# Patient Record
Sex: Male | Born: 1965 | Race: White | Hispanic: No | Marital: Married | Smoking: Never smoker
Health system: Southern US, Community
[De-identification: ages and names within clinical notes are randomized; demographics above are authoritative.]

## PROBLEM LIST (undated history)

## (undated) DIAGNOSIS — R112 Nausea with vomiting, unspecified: Secondary | ICD-10-CM

## (undated) DIAGNOSIS — E079 Disorder of thyroid, unspecified: Secondary | ICD-10-CM

## (undated) DIAGNOSIS — J302 Other seasonal allergic rhinitis: Secondary | ICD-10-CM

## (undated) DIAGNOSIS — C801 Malignant (primary) neoplasm, unspecified: Secondary | ICD-10-CM

## (undated) DIAGNOSIS — T4145XA Adverse effect of unspecified anesthetic, initial encounter: Secondary | ICD-10-CM

## (undated) DIAGNOSIS — J45909 Unspecified asthma, uncomplicated: Secondary | ICD-10-CM

## (undated) DIAGNOSIS — M1990 Unspecified osteoarthritis, unspecified site: Secondary | ICD-10-CM

## (undated) DIAGNOSIS — Z9889 Other specified postprocedural states: Secondary | ICD-10-CM

## (undated) DIAGNOSIS — T8859XA Other complications of anesthesia, initial encounter: Secondary | ICD-10-CM

## (undated) DIAGNOSIS — Z8489 Family history of other specified conditions: Secondary | ICD-10-CM

## (undated) DIAGNOSIS — T7840XA Allergy, unspecified, initial encounter: Secondary | ICD-10-CM

## (undated) DIAGNOSIS — D179 Benign lipomatous neoplasm, unspecified: Secondary | ICD-10-CM

## (undated) HISTORY — DX: Benign lipomatous neoplasm, unspecified: D17.9

## (undated) HISTORY — PX: COLONOSCOPY: SHX174

## (undated) HISTORY — PX: SPINE SURGERY: SHX786

## (undated) HISTORY — PX: ROTATOR CUFF REPAIR: SHX139

## (undated) HISTORY — DX: Disorder of thyroid, unspecified: E07.9

## (undated) HISTORY — PX: COLONOSCOPY: SHX5424

## (undated) HISTORY — PX: COLONOSCOPY W/ POLYPECTOMY: SHX1380

## (undated) HISTORY — PX: LUMBAR FUSION: SHX111

## (undated) HISTORY — DX: Other seasonal allergic rhinitis: J30.2

## (undated) HISTORY — DX: Unspecified osteoarthritis, unspecified site: M19.90

## (undated) HISTORY — DX: Allergy, unspecified, initial encounter: T78.40XA

---

## 2003-06-13 ENCOUNTER — Emergency Department (HOSPITAL_COMMUNITY): Admission: EM | Admit: 2003-06-13 | Discharge: 2003-06-13 | Payer: Self-pay

## 2005-01-20 HISTORY — PX: HIP SURGERY: SHX245

## 2005-05-20 ENCOUNTER — Ambulatory Visit: Payer: Self-pay | Admitting: Internal Medicine

## 2005-08-06 ENCOUNTER — Ambulatory Visit (HOSPITAL_COMMUNITY): Admission: RE | Admit: 2005-08-06 | Discharge: 2005-08-06 | Payer: Self-pay | Admitting: Orthopedic Surgery

## 2005-12-19 ENCOUNTER — Ambulatory Visit: Payer: Self-pay | Admitting: Internal Medicine

## 2006-01-02 ENCOUNTER — Ambulatory Visit: Payer: Self-pay | Admitting: Internal Medicine

## 2007-07-19 ENCOUNTER — Ambulatory Visit: Payer: Self-pay | Admitting: Internal Medicine

## 2007-07-19 LAB — CONVERTED CEMR LAB
Albumin: 4.1 g/dL (ref 3.5–5.2)
Bilirubin Urine: NEGATIVE
Bilirubin, Direct: 0.1 mg/dL (ref 0.0–0.3)
Blood in Urine, dipstick: NEGATIVE
CO2: 32 meq/L (ref 19–32)
Cholesterol: 181 mg/dL (ref 0–200)
Eosinophils Absolute: 0.2 10*3/uL (ref 0.0–0.7)
GFR calc Af Amer: 106 mL/min
GFR calc non Af Amer: 88 mL/min
HCT: 45.5 % (ref 39.0–52.0)
HDL: 42.5 mg/dL (ref >39.0)
LDL Cholesterol: 120 mg/dL — ABNORMAL HIGH (ref 0–99)
MCHC: 34.4 g/dL (ref 30.0–36.0)
MCV: 87.8 fL (ref 78.0–100.0)
Monocytes Absolute: 0.5 10*3/uL (ref 0.1–1.0)
Monocytes Relative: 9.2 % (ref 3.0–12.0)
Neutro Abs: 3 10*3/uL (ref 1.4–7.7)
Platelets: 225 10*3/uL (ref 150–400)
Protein, U semiquant: NEGATIVE
RBC: 5.18 M/uL (ref 4.22–5.81)
RDW: 12.3 % (ref 11.5–14.6)
Sodium: 141 meq/L (ref 135–145)
Specific Gravity, Urine: 1.01
Total CHOL/HDL Ratio: 4.3
VLDL: 18 mg/dL (ref 0–40)
WBC: 5.2 10*3/uL (ref 4.5–10.5)

## 2007-07-27 ENCOUNTER — Ambulatory Visit: Payer: Self-pay | Admitting: Internal Medicine

## 2007-07-27 DIAGNOSIS — J309 Allergic rhinitis, unspecified: Secondary | ICD-10-CM | POA: Insufficient documentation

## 2007-07-27 DIAGNOSIS — I10 Essential (primary) hypertension: Secondary | ICD-10-CM | POA: Insufficient documentation

## 2007-07-27 DIAGNOSIS — R10811 Right upper quadrant abdominal tenderness: Secondary | ICD-10-CM | POA: Insufficient documentation

## 2007-08-30 ENCOUNTER — Ambulatory Visit: Payer: Self-pay | Admitting: Internal Medicine

## 2007-08-30 ENCOUNTER — Encounter: Payer: Self-pay | Admitting: Internal Medicine

## 2007-08-30 DIAGNOSIS — M109 Gout, unspecified: Secondary | ICD-10-CM | POA: Insufficient documentation

## 2007-08-30 DIAGNOSIS — C44599 Other specified malignant neoplasm of skin of other part of trunk: Secondary | ICD-10-CM | POA: Insufficient documentation

## 2008-02-14 ENCOUNTER — Telehealth: Payer: Self-pay | Admitting: Internal Medicine

## 2008-02-22 ENCOUNTER — Ambulatory Visit: Payer: Self-pay | Admitting: Internal Medicine

## 2008-02-22 DIAGNOSIS — E291 Testicular hypofunction: Secondary | ICD-10-CM | POA: Insufficient documentation

## 2008-02-22 DIAGNOSIS — R635 Abnormal weight gain: Secondary | ICD-10-CM | POA: Insufficient documentation

## 2008-02-22 LAB — CONVERTED CEMR LAB
Cortisol, Plasma: 7.9 ug/dL
Testosterone: 253.15 ng/dL — ABNORMAL LOW (ref 350.00–890)

## 2008-02-23 ENCOUNTER — Telehealth: Payer: Self-pay | Admitting: Internal Medicine

## 2008-08-22 ENCOUNTER — Telehealth: Payer: Self-pay | Admitting: Internal Medicine

## 2008-08-24 ENCOUNTER — Ambulatory Visit: Payer: Self-pay | Admitting: Internal Medicine

## 2008-10-17 ENCOUNTER — Telehealth: Payer: Self-pay | Admitting: *Deleted

## 2008-10-27 ENCOUNTER — Ambulatory Visit: Payer: Self-pay | Admitting: Internal Medicine

## 2008-10-27 DIAGNOSIS — D179 Benign lipomatous neoplasm, unspecified: Secondary | ICD-10-CM

## 2008-10-27 HISTORY — DX: Benign lipomatous neoplasm, unspecified: D17.9

## 2009-11-23 ENCOUNTER — Telehealth: Payer: Self-pay | Admitting: Internal Medicine

## 2009-12-27 ENCOUNTER — Ambulatory Visit: Payer: Self-pay | Admitting: Internal Medicine

## 2009-12-27 ENCOUNTER — Encounter: Payer: Self-pay | Admitting: Internal Medicine

## 2009-12-27 LAB — CONVERTED CEMR LAB
Basophils Absolute: 0 10*3/uL (ref 0.0–0.1)
Bilirubin Urine: NEGATIVE
Blood in Urine, dipstick: NEGATIVE
CO2: 29 meq/L (ref 19–32)
Chloride: 102 meq/L (ref 96–112)
Cholesterol: 205 mg/dL — ABNORMAL HIGH (ref 0–200)
Creatinine, Ser: 1 mg/dL (ref 0.4–1.5)
Direct LDL: 135.7 mg/dL
Eosinophils Absolute: 0.1 10*3/uL (ref 0.0–0.7)
GFR calc non Af Amer: 84.31 mL/min (ref >60.00)
Glucose, Urine, Semiquant: NEGATIVE
HDL: 47.1 mg/dL (ref >39.00)
Hemoglobin: 16.2 g/dL (ref 13.0–17.0)
Lymphocytes Relative: 22.6 % (ref 12.0–46.0)
Lymphs Abs: 1.3 10*3/uL (ref 0.7–4.0)
MCV: 89.9 fL (ref 78.0–100.0)
Monocytes Absolute: 0.5 10*3/uL (ref 0.1–1.0)
Neutrophils Relative %: 66.6 % (ref 43.0–77.0)
Nitrite: NEGATIVE
Potassium: 4.8 meq/L (ref 3.5–5.1)
RBC: 5.2 M/uL (ref 4.22–5.81)
RDW: 12.5 % (ref 11.5–14.6)
Sodium: 140 meq/L (ref 135–145)
Specific Gravity, Urine: 1.01
Total CHOL/HDL Ratio: 4
Total Protein: 7.2 g/dL (ref 6.0–8.3)
Triglycerides: 95 mg/dL (ref 0.0–149.0)
Urobilinogen, UA: 0.2
VLDL: 19 mg/dL (ref 0.0–40.0)
WBC Urine, dipstick: NEGATIVE
pH: 7

## 2009-12-31 ENCOUNTER — Telehealth: Payer: Self-pay | Admitting: Internal Medicine

## 2010-01-02 ENCOUNTER — Encounter: Payer: Self-pay | Admitting: Internal Medicine

## 2010-02-14 ENCOUNTER — Encounter: Payer: Self-pay | Admitting: Internal Medicine

## 2010-02-15 ENCOUNTER — Encounter: Payer: Self-pay | Admitting: Internal Medicine

## 2010-02-15 ENCOUNTER — Ambulatory Visit
Admission: RE | Admit: 2010-02-15 | Discharge: 2010-02-15 | Payer: Self-pay | Source: Home / Self Care | Attending: Internal Medicine | Admitting: Internal Medicine

## 2010-02-15 DIAGNOSIS — R739 Hyperglycemia, unspecified: Secondary | ICD-10-CM | POA: Insufficient documentation

## 2010-02-15 LAB — CONVERTED CEMR LAB
Cholesterol, target level: 200 mg/dL
Hgb A1c MFr Bld: 5.5 % (ref <5.7)
Testosterone: 414.57 ng/dL (ref 250–890)

## 2010-02-19 NOTE — Progress Notes (Signed)
Summary: REQ FOR ORDER (Nicotine testing)  Phone Note Call from Patient   Caller: Patient   [phone] Summary of Call: Pt called to set up a cpx / labs.... Pt req a nicotine test be added for insurance purposes to prove that he is tobacco free  (cotinine level?)..... Can you advise order for same?  Initial call taken by: Debbra Riding,  November 23, 2009 10:30 AM  Follow-up for Phone Call        will set with cpx labs-rhonda to handle Follow-up by: Willy Eddy, LPN,  November 23, 2009 10:41 AM     Appended Document: REQ FOR ORDER (Nicotine testing) Pt notified to come in fasting for Dec. 8th, 2011 appt.... cpx labs will be done at same time nicotine testing is done per Dr Lovell Sheehan.

## 2010-02-21 NOTE — Progress Notes (Signed)
Summary: [facility] called. There is a problem will lab order  Phone Note From Other Clinic Call back at [phone] option 2   Caller: [facility] [place] Summary of Call: There is a problem with the most recent lab that was ordered. Pls call back asap.  Initial call taken by: Lucy Antigua,  December 31, 2009 1:39 PM  Follow-up for Phone Call        Called patient back at work. He needs to come back and have blood redrawn for the nicotine test. He said he would be back in [place]. afternoon. Follow-up by: Rita Ohara  Additional Follow-up for Phone Call Additional follow up Details #1::        thanks, gwen Additional Follow-up by: Willy Eddy, LPN,  December 31, 2009 2:25 PM

## 2010-02-26 ENCOUNTER — Telehealth: Payer: Self-pay | Admitting: Internal Medicine

## 2010-02-26 MED ORDER — TRAMADOL HCL 50 MG PO TABS: 50.0000 mg | ORAL_TABLET | Freq: Four times a day (QID) | ORAL | Status: DC | PRN

## 2010-02-26 NOTE — Telephone Encounter (Signed)
Pt called to check on lab results and also to get a script for Tramadol, as previously discussed.

## 2010-02-26 NOTE — Telephone Encounter (Signed)
Please disregard previous note- it was written in error- lab results given to pt and will send in tramadol

## 2010-02-26 NOTE — Telephone Encounter (Signed)
Pt informed daw medicine was sent in by dr Lovell Sheehan

## 2010-02-27 NOTE — Medication Information (Signed)
Summary: PA and Approval for Androderm  PA and Approval for Androderm   Imported By: Maryln Gottron 02/20/2010 08:52:16  _____________________________________________________________________  External Attachment:    Type:   Image     Comment:   External Document

## 2010-03-07 NOTE — Assessment & Plan Note (Signed)
Summary: CPX // RS   Vital Signs:  Patient profile:   45 year old male Height:      71 inches Weight:      174 pounds BMI:     24.36 Temp:     98.1 degrees F oral Pulse rate:   56 / minute Resp:     14 per minute BP sitting:   130 / 70  (left arm)  Vitals Entered By: Willy Eddy, LPN (February 15, 2010 3:28 PM) CC: cpx, Lipid Management Is Patient Diabetic? No   Primary Care Provider:  Stacie Glaze MD  CC:  cpx and Lipid Management.  History of Present Illness: The pt was asked about all immunizations, health maint. services that are appropriate to their age and was given guidance on diet exercize  and weight management   Lipid Management History:      Positive NCEP/ATP III risk factors include hypertension.  Negative NCEP/ATP III risk factors include male age less than 53 years old, non-diabetic, no family history for ischemic heart disease, non-tobacco-user status, no ASHD (atherosclerotic heart disease), no prior stroke/TIA, no peripheral vascular disease, and no history of aortic aneurysm.    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) 8)  Abdominal Tenderness Right Upper Quadrant  (ICD-789.61) 9)  Preventive Health Care  (ICD-V70.0) 10)  Family History of Colon Ca 1st Degree Relative <60  (ICD-V16.0) 11)  Allergic Rhinitis  (ICD-477.9) 12)  Hypertension  (ICD-401.9)  Current Problems (verified): 1)  Hypogonadism  (ICD-257.2) 2)  Lipoma of Other Skin and Subcutaneous Tissue   (ICD-214.1) 3)  Testosterone Deficiency  (ICD-257.2) 4)  Weight Gain  (ICD-783.1) 5)  Neoplasm, Malignant, Skin, 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 4 Pumps To Skin Daily As Directed 2)  Vitamin D 95621 Unit Caps (Ergocalciferol) .Marland Kitchen.. 1 Every Week  Current Medications (verified): 1)  Testim 50 Mg/5gm Gel (Testosterone) .... Uad As Directed 2)  Vitamin D 1000 Unit Caps (Cholecalciferol) .Marland Kitchen.. 1 Once Daily  Allergies (verified): No Known Drug Allergies  Past History:  Family History: Last updated: 07/27/2007 COPD Family History Hypertension Family History of Colon CA 1st degree relative <60  Social History: Last updated: 08/30/2007 Married Former Smoker Retired  Risk Factors: Exercise: yes (07/27/2007)  Risk Factors: Smoking Status: quit (02/15/2010) Cans of tobacco/wk: yes (02/15/2010) Passive Smoke Exposure: no (02/15/2010)  Past medical, surgical, family and social histories (including risk factors) reviewed, and no changes noted (except as noted below).  Past Medical History: Reviewed history from 07/27/2007 and no changes required. Hypertension Allergic rhinitis Testosterone def. 257.2  Family History: Reviewed  history from 07/27/2007 and no changes required. COPD Family History Hypertension Family History of Colon CA 1st degree relative <60  Social History: Reviewed history from 08/30/2007 and no changes required. Married Former Smoker Retired  Review of Systems  The patient denies anorexia, fever, weight loss, weight gain, vision loss, decreased hearing, hoarseness, chest pain,  syncope, dyspnea on exertion, peripheral edema, prolonged cough, headaches, hemoptysis, abdominal pain, melena, hematochezia, severe indigestion/heartburn, hematuria, incontinence, genital sores, muscle weakness, suspicious skin lesions, transient blindness, difficulty walking, depression, unusual weight change, abnormal bleeding, enlarged lymph nodes, angioedema, breast masses, and testicular masses.    Physical Exam  General:  Well-developed,well-nourished,in no acute distress; alert,appropriate and cooperative throughout examination Head:  normocephalic, atraumatic, and male-pattern balding.   Eyes:  No corneal or conjunctival inflammation noted. EOMI. Perrla. Funduscopic exam benign, without hemorrhages, exudates or papilledema. Vision grossly normal. Ears:  R ear normal and L ear normal.   Nose:  External nasal examination shows no deformity or inflammation. Nasal mucosa are pink and moist without lesions or exudates. Mouth:  Oral mucosa and oropharynx without lesions or exudates.  Teeth in good repair. Neck:  No deformities, masses, or tenderness noted. Chest Wall:  No deformities, masses, tenderness or gynecomastia noted. Lungs:  Normal respiratory effort, chest expands symmetrically. Lungs are clear to auscultation, no crackles or wheezes. Heart:  Normal rate and regular rhythm. S1 and S2 normal without gallop, murmur, click, rub or other extra sounds. Abdomen:  Bowel sounds positive,abdomen soft and non-tender without masses, organomegaly or hernias noted. Rectal:  No external abnormalities noted. Normal sphincter tone. No rectal masses or tenderness. Genitalia:  Testes bilaterally descended without nodularity, tenderness or masses. No scrotal masses or lesions. No penis lesions or urethral discharge. Prostate:  Prostate gland firm and smooth, no enlargement, nodularity, tenderness, mass, asymmetry or induration. Msk:  No deformity or scoliosis noted of thoracic or lumbar spine.   Pulses:   R and L carotid,radial,femoral,dorsalis pedis and posterior tibial pulses are full and equal bilaterally Neurologic:  No cranial nerve deficits noted. Station and gait are normal. Plantar reflexes are down-going bilaterally. DTRs are symmetrical throughout. Sensory, motor and coordinative functions appear intact. Skin:  tatoos Cervical Nodes:  No lymphadenopathy noted Axillary Nodes:  No palpable lymphadenopathy Inguinal Nodes:  No significant adenopathy Psych:  Cognition and judgment appear intact. Alert and cooperative with normal attention span and concentration. No apparent delusions, illusions, hallucinations   Impression & Recommendations:  Problem # 1:  PREVENTIVE HEALTH CARE (ICD-V70.0) Assessment Unchanged  The pt was asked about all immunizations, health maint. services that are appropriate to their age and was given guidance on diet exercize  and weight management  Td Booster: Tdap (07/27/2007)   Chol: 205 (12/27/2009)   HDL: 47.10 (12/27/2009)   LDL: 120 (07/19/2007)   TG: 95.0 (12/27/2009) TSH: 0.97 (12/27/2009)   PSA: 1.31 (08/24/2008)  Discussed using sunscreen, use of alcohol, drug use, self testicular exam, routine dental care, routine eye care, routine physical exam, seat belts, multiple vitamins, osteoporosis prevention, adequate calcium intake in diet, and recommendations for immunizations.  Discussed exercise and checking cholesterol.  Discussed gun safety, safe sex, and contraception. Also recommend checking PSA.  Problem # 2:  TESTOSTERONE DEFICIENCY (ICD-257.2) Assessment: Deteriorated  low t with hx of replacement  Problem # 3:  HYPERGLYCEMIA, FASTING (ICD-790.29) Assessment: Unchanged  possible due to caffene prioir to labs  Orders: Specimen Handling (11914)  Labs Reviewed: Creat: 1.0 (12/27/2009)     Complete Medication List: 1)  Testim 50 Mg/5gm Gel (Testosterone) .... Uad as directed 2)  Vitamin D 1000 Unit Caps (Cholecalciferol) .Marland Kitchen.. 1 once  daily  Other Orders: Venipuncture (40347) TLB-A1C / Hgb A1C (Glycohemoglobin) (83036-A1C) TLB-Testosterone, Total (84403-TESTO)  Lipid Assessment/Plan:      Based on NCEP/ATP III, the patient's risk factor category is "0-1 risk factors".  The patient's lipid goals are as follows: Total cholesterol goal is 200; LDL cholesterol goal is 160; HDL cholesterol goal is 40; Triglyceride goal is 150.  His LDL cholesterol goal has been met.     Patient Instructions: 1)  Please schedule a follow-up appointment in 1 year.   Orders Added: 1)  Venipuncture [36415] 2)  TLB-A1C / Hgb A1C (Glycohemoglobin) [83036-A1C] 3)  TLB-Testosterone, Total [84403-TESTO] 4)  Est. Patient 40-64 years [99396] 5)  Specimen Handling [99000]

## 2010-06-07 NOTE — Op Note (Signed)
Joshua Norris, Joshua Norris              ACCOUNT NO.:  [id number]   MEDICAL RECORD NO.:  [id number]          PATIENT TYPE:  AMB   LOCATION:  DAY                          FACILITY:  [facility]   PHYSICIAN:  Ollen Gross, M.D.    DATE OF BIRTH:  1965-08-13   DATE OF PROCEDURE:  08/06/2005  DATE OF DISCHARGE:                                 OPERATIVE REPORT   PREOPERATIVE DIAGNOSIS:  Right hip labral tear.   POSTOPERATIVE DIAGNOSIS:  Right hip labral tear, plus acetabular chondral  defect.   PROCEDURE:  Right hip arthroscopy with liver debridement and chondroplasty.   SURGEON:  Dr. Despina Hick.   ASSISTANT:  Avel Peace, P.A.-C.   ANESTHESIA:  General.   ESTIMATED BLOOD LOSS:  Minimal.   DRAINS:  None.   COMPLICATIONS:  None.   CONDITION:  Stable to recovery room.   CLINICAL NOTE:  Joshua Norris is a 45 year old male who has a 2+ year history of  discomfort in his right hip which got progressively worse over the past  couple months.  He at one point had an intra-articular hip injection which  lead to pain relief.  His exam and history are consistent with a labral  tear, and he presents now for arthroscopy and debridement.   PROCEDURE IN DETAIL:  After successful initiation of general anesthetic, the  patient's placed in the left lateral decubitus position with the right side  up.  The lateral padded perineal post is placed, and his right foot was  placed into the traction boot well padded.  Under fluoroscopic guidance, we  had tried traction to the hip to gain adequate distraction.  The hip and  thigh were then prepped and draped in usual sterile fashion.  Spinal needles  were then passed at the anterior and posterior peritrochanteric portal sites  and felt to enter the joint.  Twenty mL of saline was then injected through  posterior needle, and it showed outflow through the anterior needle  confirming that both are intra-articular.  Posterior portal was then  established, incision made with a  11 blade, and then the cannula passed into  the joint.  Once confirmed to be intra-articular, the inflow was initiated.  The fovea does not show any loose bodies.  The acetabulum overall looks  fine.  Posteriorly, there is a small labral tear which had some labral  fraying at the free margin.  There is no detachment.  The femoral head looks  normal throughout.  Anteriorly, at the anterior superior 2 o'clock position,  there is an area of labral tear and chondral blistering.  We then  established the anterior portal and probed this, and the acetabular chondral  blistering area is unstable.  Using a combination of a shaver and the  ArthroCare device, we debrided the labrum back to a stable base and then  just using a shaver debrided the small chondral defect down to a stable bony  base with stable cartilaginous edges.  The size of this is about 1 x 1 cm.  Everything appeared stable after this.  We then switched the working portal  to posterior  and camera portal to anterior.  I then debrided the posterior  labral tear which was very small back to a stable base with the shaver and  ArthroCare.  The arthroscopic equipment was then removed from the portals,  and then 20 mL of half percent Marcaine with epinephrine injected through  the inflow cannula, and then that is removed.  Traction is removed the  joint, and C-arm fluoroscopy confirms that there is concentric reduction of  the hip.  The incisions were then closed with interrupted 4-0 nylon.  Bulky  sterile dressings applied.  He was taken out of the traction boot, placed in  supine position, awakened and transferred to recovery in stable condition.      Ollen Gross, M.D.  Electronically Signed     FA/MEDQ  D:  08/06/2005  T:  08/06/2005  Job:  [id number]

## 2010-07-29 ENCOUNTER — Other Ambulatory Visit: Payer: Self-pay | Admitting: *Deleted

## 2010-07-29 MED ORDER — TESTOSTERONE 50 MG/5GM (1%) TD GEL: 5.0000 g | Freq: Every day | TRANSDERMAL | Status: DC

## 2010-12-28 ENCOUNTER — Encounter: Payer: Self-pay | Admitting: Internal Medicine

## 2011-02-04 ENCOUNTER — Other Ambulatory Visit: Payer: Self-pay | Admitting: *Deleted

## 2011-02-04 MED ORDER — TESTOSTERONE 50 MG/5GM (1%) TD GEL: 5.0000 g | Freq: Every day | TRANSDERMAL | Status: DC

## 2011-04-09 ENCOUNTER — Other Ambulatory Visit: Payer: Self-pay | Admitting: Internal Medicine

## 2011-08-15 ENCOUNTER — Other Ambulatory Visit: Payer: Self-pay | Admitting: *Deleted

## 2011-08-15 MED ORDER — TESTOSTERONE 50 MG/5GM (1%) TD GEL: 5.0000 g | Freq: Every day | TRANSDERMAL | Status: DC

## 2011-10-17 ENCOUNTER — Encounter: Payer: Self-pay | Admitting: Internal Medicine

## 2011-11-06 ENCOUNTER — Encounter: Payer: Self-pay | Admitting: Internal Medicine

## 2011-12-02 ENCOUNTER — Telehealth: Payer: Self-pay | Admitting: Internal Medicine

## 2011-12-02 NOTE — Telephone Encounter (Signed)
done

## 2011-12-02 NOTE — Telephone Encounter (Signed)
Pt would like to add PSA and testosterone level to cpx labs due to family history cancer and on testim. Can I sch?

## 2011-12-12 ENCOUNTER — Ambulatory Visit (AMBULATORY_SURGERY_CENTER): Payer: BC Managed Care – PPO | Admitting: *Deleted

## 2011-12-12 VITALS — Ht 70.0 in | Wt 178.0 lb

## 2011-12-12 DIAGNOSIS — Z800 Family history of malignant neoplasm of digestive organs: Secondary | ICD-10-CM

## 2011-12-12 DIAGNOSIS — Z1211 Encounter for screening for malignant neoplasm of colon: Secondary | ICD-10-CM

## 2011-12-12 MED ORDER — MOVIPREP 100 G PO SOLR: ORAL | Status: DC

## 2012-01-01 ENCOUNTER — Encounter: Payer: Self-pay | Admitting: Internal Medicine

## 2012-01-01 ENCOUNTER — Ambulatory Visit (AMBULATORY_SURGERY_CENTER): Payer: BC Managed Care – PPO | Admitting: Internal Medicine

## 2012-01-01 VITALS — BP 131/88 | HR 53 | Temp 97.1°F | Resp 14 | Ht 70.0 in | Wt 178.0 lb

## 2012-01-01 DIAGNOSIS — Z800 Family history of malignant neoplasm of digestive organs: Secondary | ICD-10-CM

## 2012-01-01 DIAGNOSIS — D126 Benign neoplasm of colon, unspecified: Secondary | ICD-10-CM

## 2012-01-01 DIAGNOSIS — Z1211 Encounter for screening for malignant neoplasm of colon: Secondary | ICD-10-CM

## 2012-01-01 MED ORDER — SODIUM CHLORIDE 0.9 % IV SOLN: 500.0000 mL | INTRAVENOUS | Status: DC

## 2012-01-01 NOTE — Progress Notes (Signed)
Propofol given over incremental dosages 

## 2012-01-01 NOTE — Op Note (Signed)
[facility] [address] [place], [address]   COLONOSCOPY PROCEDURE REPORT  PATIENT: Joshua Norris, Joshua Norris  MR#: [id number] BIRTHDATE: 1965-08-02 , 46  yrs. old GENDER: Male ENDOSCOPIST: Roxy Cedar, MD REFERRED XB:[id number] Recall PROCEDURE DATE:  01/01/2012 PROCEDURE:   Colonoscopy with snare polypectomy    x 1 ASA CLASS:   Class I INDICATIONS:Patient's immediate family (dad 94's) history of colon cancer.   Index exam 12-2005 negative MEDICATIONS: MAC sedation, administered by CRNA and propofol (Diprivan) 400mg  IV  DESCRIPTION OF PROCEDURE:   After the risks benefits and alternatives of the procedure were thoroughly explained, informed consent was obtained.  A digital rectal exam revealed no abnormalities of the rectum.   The LB [id number]  endoscope was introduced through the anus and advanced to the cecum, which was identified by both the appendix and ileocecal valve. No adverse events experienced.   The quality of the prep was good, using MoviPrep  The instrument was then slowly withdrawn as the colon was fully examined.      COLON FINDINGS: A single polyp 5mm in size was found at the cecum. A polypectomy was performed with a cold snare.  The resection was complete and the polyp tissue was completely retrieved.   Mild diverticulosis was noted in the sigmoid colon.   The colon mucosa was otherwise normal.  Retroflexed views revealed no abnormalities. The time to cecum=3 minutes 03 seconds.  Withdrawal time=13 minutes 06 seconds.  The scope was withdrawn and the procedure completed. COMPLICATIONS: There were no complications.  ENDOSCOPIC IMPRESSION: 1.   Single polyp 5mm in size was found at the cecum; polypectomy was performed with a cold snare 2.   Mild diverticulosis was noted in the sigmoid colon 3.   The colon mucosa was otherwise normal  RECOMMENDATIONS: 1. Follow up colonoscopy in 5 years   eSigned:  Roxy Cedar, MD  01/01/2012 12:40 PM   cc: Stacie Glaze, MD and The Patient   PATIENT NAME:  Joshua Norris, Joshua Norris MR#: [id number]

## 2012-01-01 NOTE — Progress Notes (Signed)
Patient did not experience any of the following events: a burn prior to discharge; a fall within the facility; wrong site/side/patient/procedure/implant event; or a hospital transfer or hospital admission upon discharge from the facility. (G8907) Patient did not have preoperative order for IV antibiotic SSI prophylaxis. (G8918)  

## 2012-01-01 NOTE — Progress Notes (Signed)
Called to room to assist during endoscopic procedure.  Patient ID and intended procedure confirmed with present staff. Received instructions for my participation in the procedure from the performing physician. ewm 

## 2012-01-01 NOTE — Patient Instructions (Addendum)

## 2012-01-02 ENCOUNTER — Telehealth: Payer: Self-pay | Admitting: *Deleted

## 2012-01-02 NOTE — Telephone Encounter (Signed)
Name identifier, left message follow-up  

## 2012-01-07 ENCOUNTER — Encounter: Payer: Self-pay | Admitting: Internal Medicine

## 2012-02-17 ENCOUNTER — Other Ambulatory Visit: Payer: Self-pay | Admitting: Internal Medicine

## 2012-02-24 ENCOUNTER — Other Ambulatory Visit (INDEPENDENT_AMBULATORY_CARE_PROVIDER_SITE_OTHER): Payer: BC Managed Care – PPO

## 2012-02-24 DIAGNOSIS — Z0000 Encounter for general adult medical examination without abnormal findings: Secondary | ICD-10-CM

## 2012-02-24 LAB — BASIC METABOLIC PANEL WITH GFR
BUN: 13 mg/dL (ref 6–23)
CO2: 32 meq/L (ref 19–32)
Calcium: 9.4 mg/dL (ref 8.4–10.5)
Chloride: 104 meq/L (ref 96–112)
Creatinine, Ser: 0.9 mg/dL (ref 0.4–1.5)
GFR: 91.75 mL/min
Glucose, Bld: 107 mg/dL — ABNORMAL HIGH (ref 70–99)
Potassium: 5.8 meq/L — ABNORMAL HIGH (ref 3.5–5.1)
Sodium: 140 meq/L (ref 135–145)

## 2012-02-24 LAB — HEPATIC FUNCTION PANEL
ALT: 23 U/L (ref 0–53)
AST: 24 U/L (ref 0–37)
Albumin: 4.1 g/dL (ref 3.5–5.2)
Total Bilirubin: 0.7 mg/dL (ref 0.3–1.2)
Total Protein: 6.9 g/dL (ref 6.0–8.3)

## 2012-02-24 LAB — POCT URINALYSIS DIPSTICK
Ketones, UA: NEGATIVE
Protein, UA: NEGATIVE
Spec Grav, UA: 1.015

## 2012-02-24 LAB — CBC WITH DIFFERENTIAL/PLATELET
Basophils Relative: 0.4 % (ref 0.0–3.0)
Eosinophils Relative: 2.4 % (ref 0.0–5.0)
HCT: 47.2 % (ref 39.0–52.0)
Hemoglobin: 16.2 g/dL (ref 13.0–17.0)
Lymphs Abs: 1.1 10*3/uL (ref 0.7–4.0)
Monocytes Relative: 8.1 % (ref 3.0–12.0)
Neutro Abs: 4.6 10*3/uL (ref 1.4–7.7)
WBC: 6.4 10*3/uL (ref 4.5–10.5)

## 2012-02-24 LAB — TSH: TSH: 0.95 u[IU]/mL (ref 0.35–5.50)

## 2012-02-24 LAB — TESTOSTERONE: Testosterone: 171.82 ng/dL — ABNORMAL LOW (ref 350.00–890.00)

## 2012-02-24 LAB — LIPID PANEL
Cholesterol: 168 mg/dL (ref 0–200)
LDL Cholesterol: 108 mg/dL — ABNORMAL HIGH (ref 0–99)
VLDL: 18.4 mg/dL (ref 0.0–40.0)

## 2012-03-03 ENCOUNTER — Encounter: Payer: Self-pay | Admitting: Internal Medicine

## 2012-03-03 ENCOUNTER — Ambulatory Visit (INDEPENDENT_AMBULATORY_CARE_PROVIDER_SITE_OTHER): Payer: BC Managed Care – PPO | Admitting: Internal Medicine

## 2012-03-03 VITALS — BP 110/70 | HR 72 | Temp 98.2°F | Resp 16 | Ht 70.0 in | Wt 178.0 lb

## 2012-03-03 DIAGNOSIS — E291 Testicular hypofunction: Secondary | ICD-10-CM

## 2012-03-03 DIAGNOSIS — D229 Melanocytic nevi, unspecified: Secondary | ICD-10-CM

## 2012-03-03 DIAGNOSIS — D239 Other benign neoplasm of skin, unspecified: Secondary | ICD-10-CM

## 2012-03-03 DIAGNOSIS — Z0000 Encounter for general adult medical examination without abnormal findings: Secondary | ICD-10-CM

## 2012-03-03 DIAGNOSIS — E875 Hyperkalemia: Secondary | ICD-10-CM

## 2012-03-03 LAB — BASIC METABOLIC PANEL
BUN: 16 mg/dL (ref 6–23)
CO2: 29 mEq/L (ref 19–32)
Chloride: 100 mEq/L (ref 96–112)
Potassium: 4.6 mEq/L (ref 3.5–5.1)

## 2012-03-03 MED ORDER — TESTOSTERONE 20.25 MG/1.25GM (1.62%) TD GEL: 1.0000 "application " | Freq: Every day | TRANSDERMAL | Status: DC

## 2012-03-03 NOTE — Progress Notes (Signed)
Subjective:    Patient ID: Joshua Norris, male    DOB: [birth year], 47 y.o.   MRN: [id number]  HPI  Patient presents for yearly examination.  He is a healthy 47 year old man who exercises regularly.  He presents with good blood pressure we reviewed his blood work which was all within normal limits with exception of an elevated potassium which may be related to 2 diet and that his wife also had an elevated potassium and the diet rich in potassium-containing foods.  He has hypogonadism on replacement of his potassium levels were low  Review of Systems  Constitutional: Negative for fever and fatigue.  HENT: Negative for hearing loss, congestion, neck pain and postnasal drip.   Eyes: Negative for discharge, redness and visual disturbance.  Respiratory: Negative for cough, shortness of breath and wheezing.   Cardiovascular: Negative for leg swelling.  Gastrointestinal: Negative for abdominal pain, constipation and abdominal distention.  Genitourinary: Negative for urgency and frequency.  Musculoskeletal: Negative for joint swelling and arthralgias.  Skin: Negative for color change and rash.  Neurological: Negative for weakness and light-headedness.  Hematological: Negative for adenopathy.  Psychiatric/Behavioral: Negative for behavioral problems.   Past Medical History  Diagnosis Date  . Arthritis   . Seasonal allergies     History   Social History  . Marital Status: Married    Spouse Name: N/A    Number of Children: N/A  . Years of Education: N/A   Occupational History  . Not on file.   Social History Main Topics  . Smoking status: Never Smoker   . Smokeless tobacco: Former Neurosurgeon    Quit date: 12/12/2007  . Alcohol Use: Yes     Comment: occasional  . Drug Use: No  . Sexually Active: Not on file   Other Topics Concern  . Not on file   Social History Narrative  . No narrative on file    Past Surgical History  Procedure Laterality Date  . Hip surgery  2007    right; torn labrum    Family History  Problem Relation Age of Onset  . Colon cancer Father 83  . Pancreatic cancer Father 13  . Stomach cancer Neg Hx     No Known Allergies  Current Outpatient Prescriptions on File Prior to Visit  Medication Sig Dispense Refill  . B Complex Vitamins (B COMPLEX-B12 PO) Take by mouth daily.      . cholecalciferol (VITAMIN D) 1000 UNITS tablet Take 1,000 Units by mouth daily.      . Coenzyme Q10 (COQ10 PO) Take by mouth daily.      . fish oil-omega-3 fatty acids 1000 MG capsule Take 1 g by mouth 2 (two) times daily.      . Flaxseed, Linseed, (FLAXSEED OIL PO) Take by mouth daily.      . Ginkgo Biloba (GINKOBA PO) Take by mouth 2 (two) times daily.      Marland Kitchen glucosamine-chondroitin 500-400 MG tablet Take 1 tablet by mouth daily.      Marland Kitchen GRAPE SEED EXTRACT PO Take by mouth daily.      . TESTIM 50 MG/5GM GEL APPLY ONCE A DAY AS DIRECTED  150 g  5  . traMADol (ULTRAM) 50 MG tablet TAKE 1 TABLET BY MOUTH EVERY 6 HOURS AS NEEDED FOR PAIN  50 tablet  6   No current facility-administered medications on file prior to visit.    BP 110/70  Pulse 72  Temp(Src) 98.2 F (36.8 C)  Resp 16  Ht 5\' 10"  (1.778 m)  Wt 178 lb (80.74 kg)  BMI 25.54 kg/m2       Objective:   Physical Exam  Constitutional: He is oriented to person, place, and time. He appears well-developed and well-nourished.  HENT:  Head: Normocephalic and atraumatic.  Eyes: Conjunctivae are normal. Pupils are equal, round, and reactive to light.  Neck: Normal range of motion. Neck supple.  Cardiovascular: Normal rate and regular rhythm.   Pulmonary/Chest: Effort normal and breath sounds normal.  Abdominal: Soft. Bowel sounds are normal.  Musculoskeletal: Normal range of motion.  Neurological: He is alert and oriented to person, place, and time.  Skin: Skin is warm and dry.  Psychiatric: He has a normal mood and affect. His behavior is normal.          Assessment & Plan:   Patient  presents for yearly preventative medicine examination.   all immunizations and health maintenance protocols were reviewed with the patient and they are up to date with these protocols.   screening laboratory values were reviewed with the patient including screening of hyperlipidemia PSA renal function and hepatic function.   There medications past medical history social history problem list and allergies were reviewed in detail.   Goals were established with regard to weight loss exercise diet in compliance with medications

## 2012-03-03 NOTE — Addendum Note (Signed)
Addended by: Willy Eddy on: 03/03/2012 12:53 PM   Modules accepted: Orders

## 2012-03-03 NOTE — Patient Instructions (Addendum)
The patient is instructed to continue all medications as prescribed. Schedule followup with check out clerk upon leaving the clinic  

## 2012-04-16 ENCOUNTER — Other Ambulatory Visit: Payer: Self-pay | Admitting: Internal Medicine

## 2012-08-31 ENCOUNTER — Other Ambulatory Visit: Payer: Self-pay | Admitting: Internal Medicine

## 2012-09-02 ENCOUNTER — Other Ambulatory Visit: Payer: Self-pay | Admitting: *Deleted

## 2013-03-15 ENCOUNTER — Other Ambulatory Visit: Payer: Self-pay | Admitting: Internal Medicine

## 2013-03-18 ENCOUNTER — Telehealth: Payer: Self-pay | Admitting: Internal Medicine

## 2013-03-18 NOTE — Telephone Encounter (Signed)
Per dr Arnoldo Morale- he may have I will put in with his lab visit lab work

## 2013-03-18 NOTE — Telephone Encounter (Signed)
Pt would like to add to cpx labs order testosterone level,psa and cea level.Pt father was dx with prostate and colon ca

## 2013-03-18 NOTE — Telephone Encounter (Signed)
Pt is aware.  

## 2013-04-20 ENCOUNTER — Other Ambulatory Visit: Payer: Self-pay | Admitting: Internal Medicine

## 2013-05-25 ENCOUNTER — Other Ambulatory Visit: Payer: Self-pay | Admitting: Internal Medicine

## 2013-06-29 ENCOUNTER — Other Ambulatory Visit: Payer: Self-pay | Admitting: Internal Medicine

## 2013-07-19 ENCOUNTER — Other Ambulatory Visit (INDEPENDENT_AMBULATORY_CARE_PROVIDER_SITE_OTHER): Payer: BC Managed Care – PPO

## 2013-07-19 DIAGNOSIS — Z0000 Encounter for general adult medical examination without abnormal findings: Secondary | ICD-10-CM

## 2013-07-19 LAB — CBC WITH DIFFERENTIAL/PLATELET
BASOS ABS: 0 10*3/uL (ref 0.0–0.1)
Basophils Relative: 0.7 % (ref 0.0–3.0)
Eosinophils Absolute: 0.2 10*3/uL (ref 0.0–0.7)
Eosinophils Relative: 4 % (ref 0.0–5.0)
HCT: 45.2 % (ref 39.0–52.0)
Hemoglobin: 15.3 g/dL (ref 13.0–17.0)
LYMPHS PCT: 26.4 % (ref 12.0–46.0)
Lymphs Abs: 1.2 10*3/uL (ref 0.7–4.0)
MCHC: 33.8 g/dL (ref 30.0–36.0)
MCV: 88.6 fl (ref 78.0–100.0)
MONOS PCT: 9.4 % (ref 3.0–12.0)
Monocytes Absolute: 0.4 10*3/uL (ref 0.1–1.0)
NEUTROS ABS: 2.7 10*3/uL (ref 1.4–7.7)
NEUTROS PCT: 59.5 % (ref 43.0–77.0)
Platelets: 234 10*3/uL (ref 150.0–400.0)
RBC: 5.1 Mil/uL (ref 4.22–5.81)
RDW: 12.9 % (ref 11.5–15.5)
WBC: 4.6 10*3/uL (ref 4.0–10.5)

## 2013-07-19 LAB — POCT URINALYSIS DIPSTICK
BILIRUBIN UA: NEGATIVE
Blood, UA: NEGATIVE
GLUCOSE UA: NEGATIVE
KETONES UA: NEGATIVE
LEUKOCYTES UA: NEGATIVE
NITRITE UA: NEGATIVE
PH UA: 5.5
Protein, UA: NEGATIVE
Spec Grav, UA: 1.025
Urobilinogen, UA: 0.2

## 2013-07-19 LAB — LIPID PANEL
Cholesterol: 169 mg/dL (ref 0–200)
HDL: 49 mg/dL (ref >39.00)
LDL Cholesterol: 108 mg/dL — ABNORMAL HIGH (ref 0–99)
NONHDL: 120
TRIGLYCERIDES: 61 mg/dL (ref 0.0–149.0)
Total CHOL/HDL Ratio: 3
VLDL: 12.2 mg/dL (ref 0.0–40.0)

## 2013-07-19 LAB — BASIC METABOLIC PANEL WITH GFR
BUN: 17 mg/dL (ref 6–23)
CO2: 31 meq/L (ref 19–32)
Calcium: 9.3 mg/dL (ref 8.4–10.5)
Chloride: 105 meq/L (ref 96–112)
Creatinine, Ser: 1 mg/dL (ref 0.4–1.5)
GFR: 84.91 mL/min
Glucose, Bld: 102 mg/dL — ABNORMAL HIGH (ref 70–99)
Potassium: 4.7 meq/L (ref 3.5–5.1)
Sodium: 140 meq/L (ref 135–145)

## 2013-07-19 LAB — HEPATIC FUNCTION PANEL
ALBUMIN: 4.2 g/dL (ref 3.5–5.2)
ALK PHOS: 48 U/L (ref 39–117)
ALT: 22 U/L (ref 0–53)
AST: 24 U/L (ref 0–37)
BILIRUBIN DIRECT: 0.2 mg/dL (ref 0.0–0.3)
Total Bilirubin: 0.9 mg/dL (ref 0.2–1.2)
Total Protein: 7 g/dL (ref 6.0–8.3)

## 2013-07-19 LAB — TSH: TSH: 1.58 u[IU]/mL (ref 0.35–4.50)

## 2013-07-19 LAB — TESTOSTERONE: Testosterone: 273.39 ng/dL — ABNORMAL LOW (ref 300.00–890.00)

## 2013-07-19 LAB — PSA: PSA: 1.11 ng/mL (ref 0.10–4.00)

## 2013-07-20 LAB — CEA: CEA: 0.5 ng/mL (ref 0.0–5.0)

## 2013-07-25 ENCOUNTER — Encounter: Payer: BC Managed Care – PPO | Admitting: Internal Medicine

## 2013-07-28 ENCOUNTER — Telehealth: Payer: Self-pay | Admitting: Internal Medicine

## 2013-07-28 NOTE — Telephone Encounter (Signed)
Pt called to ask if he can be put back Testim because his testosterone is higher  300 -400 and what he takes now is not getting above 300

## 2013-07-29 ENCOUNTER — Encounter: Payer: Self-pay | Admitting: Internal Medicine

## 2013-07-29 MED ORDER — TESTOSTERONE 50 MG/5GM (1%) TD GEL: 5.0000 g | Freq: Every day | TRANSDERMAL | Status: DC

## 2013-07-29 NOTE — Telephone Encounter (Signed)
change back to testim 5 gm apply to skin daily generic a ok

## 2013-07-29 NOTE — Telephone Encounter (Signed)
rx called in

## 2014-01-31 ENCOUNTER — Telehealth: Payer: Self-pay | Admitting: Internal Medicine

## 2014-01-31 NOTE — Telephone Encounter (Signed)
Yes enough to get through visit and we will discuss at that time

## 2014-01-31 NOTE — Telephone Encounter (Signed)
Is this ok to refill?  

## 2014-01-31 NOTE — Telephone Encounter (Signed)
Pt request refill testosterone (TESTIM) 50 MG/5GM (1%) GEL Pt has appt 2/25 w/ dr hunter  [facility] [address]

## 2014-02-01 MED ORDER — TESTOSTERONE 50 MG/5GM (1%) TD GEL: 5.0000 g | Freq: Every day | TRANSDERMAL | Status: DC

## 2014-02-01 NOTE — Telephone Encounter (Signed)
Medication called in 

## 2014-02-09 ENCOUNTER — Telehealth: Payer: Self-pay | Admitting: Internal Medicine

## 2014-02-09 NOTE — Telephone Encounter (Signed)
Joshua Norris, since he has failed androgel, can we resubmit? Thanks!

## 2014-02-09 NOTE — Telephone Encounter (Signed)
See below

## 2014-02-09 NOTE — Telephone Encounter (Signed)
PA for testosterone was denied.  Patient must try and fail one of the formulary alternatives:  Androgel or Androderm.

## 2014-02-09 NOTE — Telephone Encounter (Signed)
LM on pt VM tcb

## 2014-02-09 NOTE — Telephone Encounter (Signed)
Pt states he has been on a double pump of androgel %1.62 which did not work for him and he has tried Testim which also did not work. He states he has been on Testosterone for 5years with [name] and does not know why he has to have a PA for this. He states if he has no other choice he will try Androderm.

## 2014-02-09 NOTE — Telephone Encounter (Signed)
Please inform patient. Does he want to try alternative androgel?

## 2014-02-13 ENCOUNTER — Encounter: Payer: Self-pay | Admitting: Internal Medicine

## 2014-02-13 NOTE — Telephone Encounter (Signed)
Appeal has been faxed to [phone].

## 2014-02-13 NOTE — Telephone Encounter (Signed)
Pt following up on prior auth request for testim.  Have we heard anything yet? Thanks

## 2014-02-20 NOTE — Telephone Encounter (Signed)
Do you still have the paper with the Rx info on it?

## 2014-02-20 NOTE — Telephone Encounter (Signed)
An approval was received for the appeal submitted. Can you please re-send medication to pharmacy.

## 2014-02-20 NOTE — Telephone Encounter (Signed)
No, but it is still on his med list.

## 2014-02-21 MED ORDER — TESTOSTERONE 50 MG/5GM (1%) TD GEL: 5.0000 g | Freq: Every day | TRANSDERMAL | Status: DC

## 2014-02-21 NOTE — Telephone Encounter (Signed)
Testim resent

## 2014-02-24 ENCOUNTER — Telehealth: Payer: Self-pay | Admitting: Internal Medicine

## 2014-02-24 MED ORDER — TESTOSTERONE 4 MG/24HR TD PT24: 1.0000 | MEDICATED_PATCH | Freq: Every day | TRANSDERMAL | Status: DC

## 2014-02-24 NOTE — Telephone Encounter (Signed)
Please advise 

## 2014-02-24 NOTE — Telephone Encounter (Signed)
Printed and on your desk Joshua Norris.

## 2014-02-24 NOTE — Telephone Encounter (Signed)
Pt states the testosterone (TESTIM) 50 MG/5GM (1%) GEL was denied by insurance and is $398.00.  The alternative they will cover per pt is androderm.  Pt has appt 2/25 but wants to know if you can call in the androderm bc he has been off for a month. [facility] [address]

## 2014-02-27 MED ORDER — TESTOSTERONE 4 MG/24HR TD PT24: 1.0000 | MEDICATED_PATCH | Freq: Every day | TRANSDERMAL | Status: DC

## 2014-02-27 NOTE — Telephone Encounter (Signed)
Patient can take either. Whichever is covered and less expensive. I ordered the androderm as an alternative due to cost but if testim is going to be less expensive, then that is fine.

## 2014-02-27 NOTE — Telephone Encounter (Signed)
Medication refilled

## 2014-02-27 NOTE — Telephone Encounter (Signed)
See below

## 2014-02-27 NOTE — Telephone Encounter (Signed)
PA was received for Androderm.  I spoke to [facility] on [facility] and was told the patient paid $265.00 on 02/25/14 for the Testim, which the Appeal I was asked to submit for Testim was approved.   Which medication is the patient suppose to be on?

## 2014-03-16 ENCOUNTER — Ambulatory Visit (INDEPENDENT_AMBULATORY_CARE_PROVIDER_SITE_OTHER): Payer: BLUE CROSS/BLUE SHIELD | Admitting: Family Medicine

## 2014-03-16 ENCOUNTER — Encounter: Payer: Self-pay | Admitting: Family Medicine

## 2014-03-16 VITALS — BP 126/74 | Temp 98.0°F | Wt 185.0 lb

## 2014-03-16 DIAGNOSIS — Z86010 Personal history of colonic polyps: Secondary | ICD-10-CM | POA: Insufficient documentation

## 2014-03-16 DIAGNOSIS — I10 Essential (primary) hypertension: Secondary | ICD-10-CM | POA: Diagnosis not present

## 2014-03-16 DIAGNOSIS — M1990 Unspecified osteoarthritis, unspecified site: Secondary | ICD-10-CM | POA: Insufficient documentation

## 2014-03-16 DIAGNOSIS — E291 Testicular hypofunction: Secondary | ICD-10-CM

## 2014-03-16 DIAGNOSIS — Z808 Family history of malignant neoplasm of other organs or systems: Secondary | ICD-10-CM | POA: Insufficient documentation

## 2014-03-16 MED ORDER — TESTOSTERONE 50 MG/5GM (1%) TD GEL: 5.0000 g | Freq: Every day | TRANSDERMAL | Status: DC

## 2014-03-16 NOTE — Assessment & Plan Note (Signed)
Monitor 3x4 mm lesion on right low back

## 2014-03-16 NOTE — Progress Notes (Signed)
Joshua Reddish, MD Phone: [phone]  Subjective:  Patient presents today to establish care with me as their new primary care provider. Patient was formerly a patient of [name]. Chief complaint-noted.   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 Currently Testim as androgel levels were in 100s before change.  ROS- denies fatigue or erectile difficulties  Patient is very worried about cancer risk due to family history Father-pancreatic-age 55 with whipple-asymptomatically detected, colon age 74-4 mets to lungs, adrenal glands lymph node, prostate-age 1 and brother diagnosed at age 56, and melanoma34 66, brother with skin cancer  The following were reviewed and entered/updated in epic: Past Medical History  Diagnosis Date  . Arthritis   . Seasonal allergies   . Lipoma 10/27/2008    Excised from behind R arm     Patient Active Problem List   Diagnosis Date Noted  . 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 Essential hypertension 07/27/2007    Priority: Medium  . Family history of melanoma 03/16/2014    Priority: Low  . History of colonic polyps 03/16/2014    Priority: Low  . Osteoarthritis 03/16/2014    Priority: Low  . Hyperglycemia 02/15/2010    Priority: Low  . Allergic rhinitis 07/27/2007    Priority: Low   Past Surgical History  Procedure Laterality Date  . Hip surgery  2007    right; torn labrum    Family History  Problem Relation Age of Onset  . Colon cancer Father 9  . Pancreatic cancer Father 20  . Stomach cancer Neg Hx     Medications- reviewed and updated Current Outpatient Prescriptions  Medication Sig Dispense Refill  . B Complex Vitamins (B COMPLEX-B12  PO) Take by mouth daily.    . cholecalciferol (VITAMIN D) 1000 UNITS tablet Take 1,000 Units by mouth daily.    . Coenzyme Q10 (COQ10 PO) Take by mouth daily.    . Ginkgo Biloba (GINKOBA PO) Take by mouth 2 (two) times daily.    Marland Kitchen glucosamine-chondroitin 500-400 MG tablet Take 1 tablet by mouth daily.    Marland Kitchen GRAPE SEED EXTRACT PO Take by mouth daily.    Marland Kitchen testosterone (TESTIM) 50 MG/5GM (1%) GEL Place 5 g onto the skin daily. 5 g 5   No current facility-administered medications for this visit.    Allergies-reviewed and updated No Known Allergies  History   Social History  . Marital Status: Married    Spouse Name: N/A  . Number of Children: N/A  . Years of Education: N/A   Social History Main Topics  . Smoking status: Never Smoker   . Smokeless tobacco: Former Systems developer    Quit date: 12/12/2007  . Alcohol Use: Yes     Comment: occasional  . Drug Use: No  . Sexual Activity: Not on file   Other Topics Concern  . Not on file   Social History Narrative    ROS--See HPI   Objective: BP 126/74 mmHg  Temp(Src) 98 F (36.7 C)  Wt 185 lb (83.915 kg) Gen: NAD, resting comfortably HEENT: Mucous membranes are moist. Oropharynx normal CV: RRR no murmurs rubs or gallops Lungs: CTAB no crackles, wheeze, rhonchi Abdomen: soft/nontender/nondistended/normal bowel sounds. No rebound or guarding.  Ext: no edema, 2+ PT pulses Skin:  warm, dry, full skin exam performed except groin. Small 3x 4 mm lesion on right low back to keep an eye on.    Assessment/Plan:  Essential hypertension Previously elevated but controlled now with diet/exercise.    Testicular hypofunction Continue Testim which has received prior approval given failure on androgel   Family history of melanoma Monitor 3x4 mm lesion on right low back   Physical in July including PSA on testosterone and testosterone  The majority of the visit we discussed his family history of cancer and his anxiety and desire to be  proactive about this as a result, >50% of 25 minute office visit was spent on counseling and coordination of care.

## 2014-03-16 NOTE — Patient Instructions (Signed)
Let's see each other in July. Come a few days before for labs (make sure labs are in July). Labs should include a PSA and testosterone along with other standard labs.

## 2014-03-16 NOTE — Assessment & Plan Note (Signed)
Previously elevated but controlled now with diet/exercise.

## 2014-03-16 NOTE — Assessment & Plan Note (Signed)
Continue Testim which has received prior approval given failure on androgel

## 2014-08-08 ENCOUNTER — Other Ambulatory Visit (INDEPENDENT_AMBULATORY_CARE_PROVIDER_SITE_OTHER): Payer: BLUE CROSS/BLUE SHIELD

## 2014-08-08 DIAGNOSIS — E349 Endocrine disorder, unspecified: Secondary | ICD-10-CM

## 2014-08-08 DIAGNOSIS — Z0000 Encounter for general adult medical examination without abnormal findings: Secondary | ICD-10-CM

## 2014-08-08 DIAGNOSIS — E291 Testicular hypofunction: Secondary | ICD-10-CM | POA: Diagnosis not present

## 2014-08-08 LAB — HEPATIC FUNCTION PANEL
ALT: 27 U/L (ref 0–53)
AST: 26 U/L (ref 0–37)
Albumin: 4.6 g/dL (ref 3.5–5.2)
Alkaline Phosphatase: 57 U/L (ref 39–117)
Bilirubin, Direct: 0.1 mg/dL (ref 0.0–0.3)
TOTAL PROTEIN: 7.2 g/dL (ref 6.0–8.3)
Total Bilirubin: 0.9 mg/dL (ref 0.2–1.2)

## 2014-08-08 LAB — POCT URINALYSIS DIPSTICK
Bilirubin, UA: NEGATIVE
Blood, UA: NEGATIVE
Glucose, UA: NEGATIVE
KETONES UA: NEGATIVE
LEUKOCYTES UA: NEGATIVE
NITRITE UA: NEGATIVE
PROTEIN UA: NEGATIVE
Spec Grav, UA: 1.015
Urobilinogen, UA: 0.2
pH, UA: 5.5

## 2014-08-08 LAB — TSH: TSH: 1.47 u[IU]/mL (ref 0.35–4.50)

## 2014-08-08 LAB — CBC WITH DIFFERENTIAL/PLATELET
BASOS ABS: 0 10*3/uL (ref 0.0–0.1)
BASOS PCT: 0.2 % (ref 0.0–3.0)
EOS PCT: 3.8 % (ref 0.0–5.0)
Eosinophils Absolute: 0.2 10*3/uL (ref 0.0–0.7)
HEMATOCRIT: 48.8 % (ref 39.0–52.0)
HEMOGLOBIN: 16.9 g/dL (ref 13.0–17.0)
Lymphocytes Relative: 21.5 % (ref 12.0–46.0)
Lymphs Abs: 1.3 10*3/uL (ref 0.7–4.0)
MCHC: 34.7 g/dL (ref 30.0–36.0)
MCV: 87.2 fl (ref 78.0–100.0)
Monocytes Absolute: 0.4 10*3/uL (ref 0.1–1.0)
Monocytes Relative: 7.2 % (ref 3.0–12.0)
Neutro Abs: 4 10*3/uL (ref 1.4–7.7)
Neutrophils Relative %: 67.3 % (ref 43.0–77.0)
Platelets: 243 10*3/uL (ref 150.0–400.0)
RBC: 5.6 Mil/uL (ref 4.22–5.81)
RDW: 13.6 % (ref 11.5–15.5)
WBC: 6 10*3/uL (ref 4.0–10.5)

## 2014-08-08 LAB — BASIC METABOLIC PANEL
BUN: 20 mg/dL (ref 6–23)
CO2: 31 meq/L (ref 19–32)
Calcium: 9.8 mg/dL (ref 8.4–10.5)
Chloride: 99 mEq/L (ref 96–112)
Creatinine, Ser: 1.02 mg/dL (ref 0.40–1.50)
GFR: 82.62 mL/min (ref >60.00)
Glucose, Bld: 116 mg/dL — ABNORMAL HIGH (ref 70–99)
Potassium: 4.3 mEq/L (ref 3.5–5.1)
Sodium: 137 mEq/L (ref 135–145)

## 2014-08-08 LAB — TESTOSTERONE: Testosterone: 446.93 ng/dL (ref 300.00–890.00)

## 2014-08-08 LAB — LIPID PANEL
Cholesterol: 212 mg/dL — ABNORMAL HIGH (ref 0–200)
HDL: 55.9 mg/dL (ref >39.00)
LDL Cholesterol: 133 mg/dL — ABNORMAL HIGH (ref 0–99)
NONHDL: 156.1
Total CHOL/HDL Ratio: 4
Triglycerides: 115 mg/dL (ref 0.0–149.0)
VLDL: 23 mg/dL (ref 0.0–40.0)

## 2014-08-08 LAB — PSA: PSA: 1.08 ng/mL (ref 0.10–4.00)

## 2014-08-14 ENCOUNTER — Encounter: Payer: Self-pay | Admitting: Family Medicine

## 2014-08-14 ENCOUNTER — Ambulatory Visit (INDEPENDENT_AMBULATORY_CARE_PROVIDER_SITE_OTHER): Payer: BLUE CROSS/BLUE SHIELD | Admitting: Family Medicine

## 2014-08-14 DIAGNOSIS — Z0000 Encounter for general adult medical examination without abnormal findings: Secondary | ICD-10-CM

## 2014-08-14 MED ORDER — TESTOSTERONE 50 MG/5GM (1%) TD GEL: 5.0000 g | Freq: Every day | TRANSDERMAL | Status: DC

## 2014-08-14 NOTE — Progress Notes (Signed)
Garret Reddish, MD Phone: [phone]  Subjective:  Patient presents today for their annual physical. Chief complaint-noted.   BP- ran 5.6 miles today. At work 120s-130s/ 74-82. Better on repeat  Epidural steroid injection a week ago- after labs. Unclear why sugars elevated- did have artificial sweetener  Father end stage colon cancer. clinical trial   2.6% 10 year risk  Bevelyn Ngo is going to Fax to pharmacy testim - controlled  ROS- full  review of systems was completed and negative except for: some back and groin pain improved after epidural steroid injection  The following were reviewed and entered/updated in epic: Past Medical History  Diagnosis Date  . Arthritis   . Seasonal allergies   . Lipoma 10/27/2008    Excised from behind R arm     Patient Active Problem List   Diagnosis Date Noted  . 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 Essential hypertension 07/27/2007    Priority: Medium  . Family history of melanoma 03/16/2014    Priority: Low  . History of colonic polyps 03/16/2014    Priority: Low  . Osteoarthritis 03/16/2014    Priority: Low  . Hyperglycemia 02/15/2010    Priority: Low  . Allergic rhinitis 07/27/2007    Priority: Low   Past Surgical History  Procedure Laterality Date  . Hip surgery  2007    right; torn labrum  . Rotator cuff repair      avulsed infraspinatus and supraspinatus   Family History  Problem Relation Age of Onset  . Colon cancer Father 63    end stage  . Pancreatic cancer Father 46    asymptomatic at presentation  . Stomach cancer Neg Hx   . Prostate cancer Father 80  . Prostate cancer Brother 69  . Melanoma Father 5  . Hypertension Mother   . Sleep apnea Mother     overweight  . Emphysema Mother     smoker    Medications- reviewed and updated Current Outpatient Prescriptions  Medication Sig Dispense Refill  . B Complex Vitamins (B COMPLEX-B12 PO) Take by mouth daily.    . cholecalciferol (VITAMIN  D) 1000 UNITS tablet Take 1,000 Units by mouth daily.    . Coenzyme Q10 (COQ10 PO) Take by mouth daily.    . Ginkgo Biloba (GINKOBA PO) Take by mouth 2 (two) times daily.    Marland Kitchen glucosamine-chondroitin 500-400 MG tablet Take 1 tablet by mouth daily.    Marland Kitchen GRAPE SEED EXTRACT PO Take by mouth daily.    Marland Kitchen testosterone (TESTIM) 50 MG/5GM (1%) GEL Place 5 g onto the skin daily. 5 g 5   No current facility-administered medications for this visit.    Allergies-reviewed and updated No Known Allergies  History   Social History  . Marital Status: Married    Spouse Name: N/A  . Number of Children: N/A  . Years of Education: N/A   Social History Main Topics  . Smoking status: Never Smoker   . Smokeless tobacco: Former Systems developer    Quit date: 12/12/2007  . Alcohol Use: Yes     Comment: occasional  . Drug Use: No  . Sexual Activity: Not on file   Other Topics Concern  . None   Social History Narrative   Married. 1 son from 51st marriage 65 years old in 2016.       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 Oropharynx normal Neck: no thyromegaly CV: RRR no murmurs rubs or gallops Lungs: CTAB no crackles, wheeze, rhonchi Abdomen: soft/nontender/nondistended/normal bowel sounds. No rebound or guarding.  Rectal: normal tone, normal prostate, no masses or tenderness Ext: no edema Skin: warm, dry, 3-4 cm lesion left low back unchanged from previous Neuro: grossly normal, moves all extremities, PERRLA  Assessment/Plan:  49 y.o. male presenting for annual physical.  Health Maintenance counseling: 1. Anticipatory guidance: Patient counseled regarding regular dental exams, eye doctor yearly- [facility], wearing seatbelts,  wearing sunscreen. Does not go to derm- prefers to be evaluated in office here.  2. Risk factor reduction:  Advised patient of need for regular exercise and diet rich and fruits and vegetables to reduce risk of heart attack and stroke.  3. Immunizations/screenings/ancillary studies Health Maintenance Due  Topic Date Due  . HIV Screening - gave blood through [employer] 12/12/1980  4. Colon cancer screening-12/2011 with 5 year repeat due to family history 5. Prostate cancer screening- complete today due to brother prostate cancer age 33. Low risk 6. Also see AVS  Essential hypertension Controlled on repeat without medication  Testicular hypofunction Controlled with testim with testosterone >400 (difficulty controlling on androgel in past)- [facility] refilling.   1 year CPE   Meds ordered this encounter  Medications  . testosterone (TESTIM) 50 MG/5GM (1%) GEL    Sig: Place 5 g onto the skin daily.    Dispense:  5 g    Refill:  5

## 2014-08-14 NOTE — Assessment & Plan Note (Signed)
Controlled on repeat without medication

## 2014-08-14 NOTE — Patient Instructions (Addendum)
2 things to watch 1. Cholesterol trended up slightly but risk is still reasonable at 2.6%. Would not advise statin at this point. Continue healthy lifestyle you are living 2. Blood sugar up 2 years in a row- let's have you have black coffee only next year  Thinking about Dad's risks leading to your risk: Prostate cancer-Low risk prostate cancer given exam and PSA level Melanoma-Skin exam normal Pancreatic cancer- most difficult thing to assess, normal abdominal exam Colon cancer- repeat colonoscopy 2018

## 2014-08-14 NOTE — Assessment & Plan Note (Signed)
Controlled with testim with testosterone >400 (difficulty controlling on androgel in past)- [facility] refilling.

## 2015-03-08 ENCOUNTER — Other Ambulatory Visit: Payer: Self-pay | Admitting: Family Medicine

## 2015-03-08 NOTE — Telephone Encounter (Signed)
Yes thanks 

## 2015-03-08 NOTE — Telephone Encounter (Signed)
Refill ok? 

## 2015-09-19 DIAGNOSIS — M5136 Other intervertebral disc degeneration, lumbar region: Secondary | ICD-10-CM | POA: Diagnosis not present

## 2015-09-25 ENCOUNTER — Other Ambulatory Visit (INDEPENDENT_AMBULATORY_CARE_PROVIDER_SITE_OTHER): Payer: BLUE CROSS/BLUE SHIELD

## 2015-09-25 DIAGNOSIS — E291 Testicular hypofunction: Secondary | ICD-10-CM | POA: Diagnosis not present

## 2015-09-25 DIAGNOSIS — Z0000 Encounter for general adult medical examination without abnormal findings: Secondary | ICD-10-CM | POA: Diagnosis not present

## 2015-09-25 LAB — CBC WITH DIFFERENTIAL/PLATELET
BASOS ABS: 0 10*3/uL (ref 0.0–0.1)
Basophils Relative: 0.3 % (ref 0.0–3.0)
EOS PCT: 1.9 % (ref 0.0–5.0)
Eosinophils Absolute: 0.2 10*3/uL (ref 0.0–0.7)
HEMATOCRIT: 47.9 % (ref 39.0–52.0)
HEMOGLOBIN: 16.7 g/dL (ref 13.0–17.0)
LYMPHS PCT: 14.9 % (ref 12.0–46.0)
Lymphs Abs: 1.4 10*3/uL (ref 0.7–4.0)
MCHC: 34.8 g/dL (ref 30.0–36.0)
MCV: 86.5 fl (ref 78.0–100.0)
MONOS PCT: 7.3 % (ref 3.0–12.0)
Monocytes Absolute: 0.7 10*3/uL (ref 0.1–1.0)
NEUTROS PCT: 75.6 % (ref 43.0–77.0)
Neutro Abs: 7.1 10*3/uL (ref 1.4–7.7)
Platelets: 256 10*3/uL (ref 150.0–400.0)
RBC: 5.54 Mil/uL (ref 4.22–5.81)
RDW: 12.8 % (ref 11.5–15.5)
WBC: 9.4 10*3/uL (ref 4.0–10.5)

## 2015-09-25 LAB — LIPID PANEL
Cholesterol: 181 mg/dL (ref 0–200)
HDL: 53.1 mg/dL (ref >39.00)
LDL Cholesterol: 110 mg/dL — ABNORMAL HIGH (ref 0–99)
NONHDL: 127.69
Total CHOL/HDL Ratio: 3
Triglycerides: 88 mg/dL (ref 0.0–149.0)
VLDL: 17.6 mg/dL (ref 0.0–40.0)

## 2015-09-25 LAB — BASIC METABOLIC PANEL
BUN: 16 mg/dL (ref 6–23)
CALCIUM: 9.4 mg/dL (ref 8.4–10.5)
CHLORIDE: 101 meq/L (ref 96–112)
CO2: 30 mEq/L (ref 19–32)
CREATININE: 1.04 mg/dL (ref 0.40–1.50)
GFR: 80.42 mL/min (ref >60.00)
Glucose, Bld: 112 mg/dL — ABNORMAL HIGH (ref 70–99)
Potassium: 4.2 mEq/L (ref 3.5–5.1)
Sodium: 137 mEq/L (ref 135–145)

## 2015-09-25 LAB — TSH: TSH: 1.38 u[IU]/mL (ref 0.35–4.50)

## 2015-09-25 LAB — HEPATIC FUNCTION PANEL
ALBUMIN: 4.4 g/dL (ref 3.5–5.2)
ALK PHOS: 60 U/L (ref 39–117)
ALT: 20 U/L (ref 0–53)
AST: 17 U/L (ref 0–37)
Bilirubin, Direct: 0.1 mg/dL (ref 0.0–0.3)
TOTAL PROTEIN: 7.3 g/dL (ref 6.0–8.3)
Total Bilirubin: 0.7 mg/dL (ref 0.2–1.2)

## 2015-09-25 LAB — POC URINALSYSI DIPSTICK (AUTOMATED)
Bilirubin, UA: NEGATIVE
Glucose, UA: NEGATIVE
Ketones, UA: NEGATIVE
Leukocytes, UA: NEGATIVE
Nitrite, UA: NEGATIVE
Protein, UA: NEGATIVE
RBC UA: NEGATIVE
UROBILINOGEN UA: 0.2
pH, UA: 6

## 2015-09-25 LAB — PSA: PSA: 1.18 ng/mL (ref 0.10–4.00)

## 2015-09-25 LAB — TESTOSTERONE: TESTOSTERONE: 114.8 ng/dL — AB (ref 300.00–890.00)

## 2015-10-01 ENCOUNTER — Ambulatory Visit (INDEPENDENT_AMBULATORY_CARE_PROVIDER_SITE_OTHER): Payer: BLUE CROSS/BLUE SHIELD | Admitting: Family Medicine

## 2015-10-01 ENCOUNTER — Encounter: Payer: Self-pay | Admitting: Family Medicine

## 2015-10-01 VITALS — BP 136/88 | HR 57 | Temp 98.2°F | Ht 70.5 in | Wt 176.8 lb

## 2015-10-01 DIAGNOSIS — Z0000 Encounter for general adult medical examination without abnormal findings: Secondary | ICD-10-CM | POA: Diagnosis not present

## 2015-10-01 NOTE — Patient Instructions (Addendum)
Flu shot at work- send [place] a [employer] when you get it  Colonoscopy due 2018  Agree seeing dermatology the right thing- you were going to call [facility]- if they request referral we can certainly do this for you  I could not find path report for the arm but provided copies of other removals  Really appreciate your perspective on the testosterone- if you become symptomatic lets update testosterone and consider adjustment depending on findings  Let's avoid any sweeteners before fasting labs next year

## 2015-10-01 NOTE — Progress Notes (Signed)
Pre visit review using our clinic review tool, if applicable. No additional management support is needed unless otherwise documented below in the visit note. 

## 2015-10-01 NOTE — Progress Notes (Signed)
Phone: [phone]  Subjective:  Patient presents today for their annual physical. Chief complaint-noted.   See problem oriented charting- ROS- full  review of systems was completed and negative except for: deals with back pain intermittently  The following were reviewed and entered/updated in epic: Past Medical History:  Diagnosis Date  . Arthritis   . Lipoma 10/27/2008   Excised from behind R arm    . Seasonal allergies    Patient Active Problem List   Diagnosis Date Noted  . 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 Essential hypertension 07/27/2007    Priority: Medium  . Family history of melanoma 03/16/2014    Priority: Low  . History of colonic polyps 03/16/2014    Priority: Low  . Osteoarthritis 03/16/2014    Priority: Low  . Hyperglycemia 02/15/2010    Priority: Low  . Allergic rhinitis 07/27/2007    Priority: Low   Past Surgical History:  Procedure Laterality Date  . HIP SURGERY  2005/05/06   right; torn labrum  . ROTATOR CUFF REPAIR     avulsed infraspinatus and supraspinatus    Family History  Problem Relation Age of Onset  . Hypertension Mother   . Sleep apnea Mother     overweight  . Emphysema Mother     smoker  . Colon cancer Father 78    end stage. died age 26 in 05/07/14.   Marland Kitchen Pancreatic cancer Father 21    asymptomatic at presentation  . Prostate cancer Father 25  . Melanoma Father 60  . Prostate cancer Brother 67    now metastatic  . Stomach cancer Neg Hx     Medications- reviewed and updated Current Outpatient Prescriptions  Medication Sig Dispense Refill  . B Complex Vitamins (B COMPLEX-B12 PO) Take by mouth daily.    . cholecalciferol (VITAMIN D) 1000 UNITS tablet Take 1,000 Units by mouth daily.    . Coenzyme Q10 (COQ10 PO) Take by mouth daily.    . Flaxseed, Linseed, (FLAX SEEDS PO) Take 1 tablet by mouth.    . Ginkgo Biloba (GINKOBA PO) Take by mouth 2 (two) times daily.    Marland Kitchen glucosamine-chondroitin 500-400 MG tablet  Take 1 tablet by mouth daily.    Marland Kitchen GRAPE SEED EXTRACT PO Take by mouth daily.    Marland Kitchen testosterone (TESTIM) 50 MG/5GM (1%) GEL Place 5 g onto the skin daily.     No current facility-administered medications for this visit.     Allergies-reviewed and updated No Known Allergies  Social History   Social History  . Marital status: Married    Spouse name: N/A  . Number of children: N/A  . Years of education: N/A   Social History Main Topics  . Smoking status: Never Smoker  . Smokeless tobacco: Former Systems developer    Quit date: 12/12/2007  . Alcohol use Yes     Comment: occasional  . Drug use: No  . Sexual activity: Not Asked   Other Topics Concern  . None   Social History Narrative   Married. 1 son from 31st marriage 67 years old in May 07, 2014.       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 Oropharynx normal Neck: no thyromegaly CV: RRR no murmurs rubs or gallops Lungs: CTAB no crackles, wheeze, rhonchi Abdomen: soft/nontender/nondistended/normal bowel sounds. No rebound or guarding.  Ext: no edema Skin: warm, dry Neuro: grossly normal, moves all extremities, PERRLA Rectal: normal tone, norma sized prostate, no masses or tenderness   Assessment/Plan:  50 y.o. male presenting for annual physical.  Health Maintenance counseling: 1. Anticipatory guidance: Patient counseled regarding regular dental exams, eye exams- yearly, wearing seatbelts.  2. Risk factor reduction:  Advised patient of need for regular exercise and diet rich and fruits and vegetables to reduce risk of heart attack and stroke. Fitness trainer 3.  Immunizations/screenings/ancillary studies- gets flu shot at work Immunization History  Administered Date(s) Administered  . Influenza-Unspecified 10/04/2013  . Td 07/27/2007   4. Prostate cancer screening- low risk based off PSA trend and rectal   Lab Results  Component Value Date   PSA 1.18 09/25/2015   PSA 1.08 08/08/2014   PSA 1.11 07/19/2013   5. Colon cancer screening - 2013 with adenoma, repeat 2018.  6. Skin cancer screening- family history ofmelanoma- he has not seen dermatology. Will do to follow up lesion right low back  Status of chronic or acute concerns  Testicular hypofunction- low on last check on testim gel Lab Results  Component Value Date   TESTOSTERONE 114.80 (L) 09/25/2015   HTN- diet/exercise controlled thoguh SBP and DBP near limits. Has lost another lb  Hyperglycemia- 112 on labs, creamer in coffee again- advised against  Hyperlipidemia- 2.8% 10 year risk- remain off statin. Intensely against- would really tighten diet if got in range  Return in about 1 year (around 09/30/2016) for physical.  Meds ordered this encounter  Medications  . testosterone (TESTIM) 50 MG/5GM (1%) GEL    Sig: Place 5 g onto the skin daily.  . Flaxseed, Linseed, (FLAX SEEDS PO)    Sig: Take 1 tablet by mouth.    Return precautions advised.   Garret Reddish, MD

## 2015-10-04 ENCOUNTER — Other Ambulatory Visit: Payer: Self-pay | Admitting: Family Medicine

## 2015-12-03 DIAGNOSIS — M545 Low back pain: Secondary | ICD-10-CM | POA: Diagnosis not present

## 2015-12-06 DIAGNOSIS — M545 Low back pain: Secondary | ICD-10-CM | POA: Diagnosis not present

## 2015-12-25 DIAGNOSIS — R030 Elevated blood-pressure reading, without diagnosis of hypertension: Secondary | ICD-10-CM | POA: Diagnosis not present

## 2015-12-25 DIAGNOSIS — M4316 Spondylolisthesis, lumbar region: Secondary | ICD-10-CM | POA: Diagnosis not present

## 2015-12-25 DIAGNOSIS — Z6825 Body mass index (BMI) 25.0-25.9, adult: Secondary | ICD-10-CM | POA: Diagnosis not present

## 2016-01-16 ENCOUNTER — Other Ambulatory Visit: Payer: Self-pay | Admitting: Family Medicine

## 2016-01-16 DIAGNOSIS — M4316 Spondylolisthesis, lumbar region: Secondary | ICD-10-CM | POA: Diagnosis not present

## 2016-02-05 DIAGNOSIS — M4316 Spondylolisthesis, lumbar region: Secondary | ICD-10-CM | POA: Diagnosis not present

## 2016-02-27 DIAGNOSIS — M47816 Spondylosis without myelopathy or radiculopathy, lumbar region: Secondary | ICD-10-CM | POA: Diagnosis not present

## 2016-03-12 DIAGNOSIS — M4316 Spondylolisthesis, lumbar region: Secondary | ICD-10-CM | POA: Diagnosis not present

## 2016-03-12 DIAGNOSIS — M47816 Spondylosis without myelopathy or radiculopathy, lumbar region: Secondary | ICD-10-CM | POA: Diagnosis not present

## 2016-03-12 DIAGNOSIS — M5136 Other intervertebral disc degeneration, lumbar region: Secondary | ICD-10-CM | POA: Diagnosis not present

## 2016-03-18 DIAGNOSIS — M47816 Spondylosis without myelopathy or radiculopathy, lumbar region: Secondary | ICD-10-CM | POA: Diagnosis not present

## 2016-03-18 DIAGNOSIS — M4316 Spondylolisthesis, lumbar region: Secondary | ICD-10-CM | POA: Diagnosis not present

## 2016-04-22 ENCOUNTER — Other Ambulatory Visit: Payer: Self-pay | Admitting: Family Medicine

## 2016-05-02 DIAGNOSIS — M5136 Other intervertebral disc degeneration, lumbar region: Secondary | ICD-10-CM | POA: Diagnosis not present

## 2016-05-02 DIAGNOSIS — M47816 Spondylosis without myelopathy or radiculopathy, lumbar region: Secondary | ICD-10-CM | POA: Diagnosis not present

## 2016-06-24 ENCOUNTER — Other Ambulatory Visit: Payer: Self-pay | Admitting: Neurological Surgery

## 2016-06-24 DIAGNOSIS — R030 Elevated blood-pressure reading, without diagnosis of hypertension: Secondary | ICD-10-CM | POA: Diagnosis not present

## 2016-06-24 DIAGNOSIS — M4316 Spondylolisthesis, lumbar region: Secondary | ICD-10-CM | POA: Diagnosis not present

## 2016-06-24 DIAGNOSIS — Z6825 Body mass index (BMI) 25.0-25.9, adult: Secondary | ICD-10-CM | POA: Diagnosis not present

## 2016-07-10 DIAGNOSIS — Z0100 Encounter for examination of eyes and vision without abnormal findings: Secondary | ICD-10-CM | POA: Diagnosis not present

## 2016-07-15 ENCOUNTER — Encounter (HOSPITAL_COMMUNITY)
Admission: RE | Admit: 2016-07-15 | Discharge: 2016-07-15 | Disposition: A | Discharge status: Home / Self Care | Payer: BLUE CROSS/BLUE SHIELD | Source: Ambulatory Visit | Attending: Neurological Surgery | Admitting: Neurological Surgery

## 2016-07-15 ENCOUNTER — Encounter (HOSPITAL_COMMUNITY): Payer: Self-pay

## 2016-07-15 ENCOUNTER — Ambulatory Visit (HOSPITAL_COMMUNITY)
Admission: RE | Admit: 2016-07-15 | Discharge: 2016-07-15 | Disposition: A | Discharge status: Home / Self Care | Payer: BLUE CROSS/BLUE SHIELD | Source: Ambulatory Visit | Attending: Neurological Surgery | Admitting: Neurological Surgery

## 2016-07-15 DIAGNOSIS — M4310 Spondylolisthesis, site unspecified: Secondary | ICD-10-CM | POA: Insufficient documentation

## 2016-07-15 DIAGNOSIS — Z01810 Encounter for preprocedural cardiovascular examination: Secondary | ICD-10-CM | POA: Insufficient documentation

## 2016-07-15 DIAGNOSIS — J45909 Unspecified asthma, uncomplicated: Secondary | ICD-10-CM | POA: Diagnosis not present

## 2016-07-15 DIAGNOSIS — R001 Bradycardia, unspecified: Secondary | ICD-10-CM | POA: Insufficient documentation

## 2016-07-15 DIAGNOSIS — Z01812 Encounter for preprocedural laboratory examination: Secondary | ICD-10-CM | POA: Insufficient documentation

## 2016-07-15 HISTORY — DX: Unspecified asthma, uncomplicated: J45.909

## 2016-07-15 HISTORY — DX: Adverse effect of unspecified anesthetic, initial encounter: T41.45XA

## 2016-07-15 HISTORY — DX: Malignant (primary) neoplasm, unspecified: C80.1

## 2016-07-15 HISTORY — DX: Other complications of anesthesia, initial encounter: T88.59XA

## 2016-07-15 HISTORY — DX: Family history of other specified conditions: Z84.89

## 2016-07-15 LAB — CBC WITH DIFFERENTIAL/PLATELET
Basophils Absolute: 0 10*3/uL (ref 0.0–0.1)
Basophils Relative: 1 %
EOS ABS: 0.2 10*3/uL (ref 0.0–0.7)
EOS PCT: 4 %
HCT: 45.9 % (ref 39.0–52.0)
Hemoglobin: 16.1 g/dL (ref 13.0–17.0)
LYMPHS ABS: 1.6 10*3/uL (ref 0.7–4.0)
LYMPHS PCT: 26 %
MCH: 30.1 pg (ref 26.0–34.0)
MCHC: 35.1 g/dL (ref 30.0–36.0)
MCV: 85.8 fL (ref 78.0–100.0)
MONO ABS: 0.4 10*3/uL (ref 0.1–1.0)
MONOS PCT: 7 %
Neutro Abs: 3.8 10*3/uL (ref 1.7–7.7)
Neutrophils Relative %: 62 %
PLATELETS: 221 10*3/uL (ref 150–400)
RBC: 5.35 MIL/uL (ref 4.22–5.81)
RDW: 12.5 % (ref 11.5–15.5)
WBC: 6.1 10*3/uL (ref 4.0–10.5)

## 2016-07-15 LAB — PROTIME-INR
INR: 0.91
PROTHROMBIN TIME: 12.3 s (ref 11.4–15.2)

## 2016-07-15 LAB — ABO/RH: ABO/RH(D): A POS

## 2016-07-15 LAB — TYPE AND SCREEN
ABO/RH(D): A POS
ANTIBODY SCREEN: NEGATIVE

## 2016-07-15 LAB — SURGICAL PCR SCREEN
MRSA, PCR: NEGATIVE
Staphylococcus aureus: NEGATIVE

## 2016-07-15 LAB — BASIC METABOLIC PANEL
Anion gap: 7 (ref 5–15)
BUN: 19 mg/dL (ref 6–20)
CHLORIDE: 103 mmol/L (ref 101–111)
CO2: 28 mmol/L (ref 22–32)
CREATININE: 1.08 mg/dL (ref 0.61–1.24)
Calcium: 9.4 mg/dL (ref 8.9–10.3)
GFR calc Af Amer: >60 mL/min (ref >60)
GFR calc non Af Amer: >60 mL/min (ref >60)
GLUCOSE: 102 mg/dL — AB (ref 65–99)
POTASSIUM: 4.3 mmol/L (ref 3.5–5.1)
SODIUM: 138 mmol/L (ref 135–145)

## 2016-07-15 NOTE — Pre-Procedure Instructions (Signed)
SALEM LEMBKE  07/15/2016    Your procedure is scheduled on Thursday, July 5.  Report to [facility] Admitting at 1200 noon               Your surgery or procedure is scheduled for 2:00 PM   Call this number if you have problems the morning of surgery [phone]               For any other questions, please call [phone], Monday - Friday 8 AM - 4 PM.    Remember:  Do not eat food or drink liquids after midnight Wednesday, July 4.  Take these medicines the morning of surgery with A SIP OF WATER : pregabalin (LYRICA).                  1 Week prior to surgery STOP taking Aspirin , Aspirin Products (Goody Powder, Excedrin Migraine), Ibuprofen (Advil), Naproxen (Aleve), Vitamins and Herbal Products (ie Fish Oil) Special instructions:   [facility]- Preparing For Surgery  Before surgery, you can play an important role. Because skin is not sterile, your skin needs to be as free of germs as possible. You can reduce the number of germs on your skin by washing with CHG (chlorahexidine gluconate) Soap before surgery.  CHG is an antiseptic cleaner which kills germs and bonds with the skin to continue killing germs even after washing.  Please do not use if you have an allergy to CHG or antibacterial soaps. If your skin becomes reddened/irritated stop using the CHG.  Do not shave (including legs and underarms) for at least 48 hours prior to first CHG shower. It is OK to shave your face.  Please follow these instructions carefully.   1. Shower the NIGHT BEFORE SURGERY and the MORNING OF SURGERY with CHG.   2. If you chose to wash your hair, wash your hair first as usual with your normal shampoo.  3. After you shampoo, rinse your hair and body thoroughly to remove the shampoo.  Wash your face and private areas with your normal soap, rinse.  4. Use CHG as you would any other liquid soap. You can apply CHG directly to the skin and wash gently with a scrungie or a clean  washcloth.   5. Apply the CHG Soap to your body ONLY FROM THE NECK DOWN.  Do not use on open wounds or open sores. Avoid contact with your eyes, ears, mouth and genitals (private parts). Wash genitals (private parts) with your normal soap.  6. Wash thoroughly, paying special attention to the area where your surgery will be performed.  7. Thoroughly rinse your body with warm water from the neck down.  8. DO NOT shower/wash with your normal soap after using and rinsing off the CHG Soap.  9. Pat yourself dry with a CLEAN TOWEL.   10. Wear CLEAN PAJAMAS   11. Place CLEAN SHEETS on your bed the night of your first shower and DO NOT SLEEP WITH PETS.  Day of Surgery: Shower as above Do not apply any deodorants/lotions, powders, colognes.. Please wear clean clothes to the hospital/surgery center.    Do not wear jewelry, make-up or nail polish.  Do not shave 48 hours prior to surgery.  Men may shave face and neck.  Do not bring valuables to the hospital.  [facility] is not responsible for any belongings or valuables.  Contacts, dentures or bridgework may not be worn into surgery.  Leave your  suitcase in the car.  After surgery it may be brought to your room.  For patients admitted to the hospital, discharge time will be determined by your treatment team.  Patients discharged the day of surgery will not be allowed to drive home.   Please read over the following fact sheets that you were given: Pain Booklet, Patient Instructions for Mupirocin Application, Incentive Spirometry, Surgical Site Infections.

## 2016-07-24 ENCOUNTER — Encounter (HOSPITAL_COMMUNITY)
Admission: RE | Disposition: A | Discharge status: Home / Self Care | Payer: Self-pay | Source: Ambulatory Visit | Attending: Neurological Surgery

## 2016-07-24 ENCOUNTER — Inpatient Hospital Stay (HOSPITAL_COMMUNITY): Payer: BLUE CROSS/BLUE SHIELD | Admitting: Certified Registered"

## 2016-07-24 ENCOUNTER — Inpatient Hospital Stay (HOSPITAL_COMMUNITY)
Admission: RE | Admit: 2016-07-24 | Discharge: 2016-07-25 | DRG: 455 | Disposition: A | Discharge status: Home / Self Care | Payer: BLUE CROSS/BLUE SHIELD | Source: Ambulatory Visit | Attending: Neurological Surgery | Admitting: Neurological Surgery

## 2016-07-24 ENCOUNTER — Inpatient Hospital Stay (HOSPITAL_COMMUNITY): Payer: BLUE CROSS/BLUE SHIELD

## 2016-07-24 ENCOUNTER — Encounter (HOSPITAL_COMMUNITY): Payer: Self-pay | Admitting: *Deleted

## 2016-07-24 DIAGNOSIS — M4326 Fusion of spine, lumbar region: Secondary | ICD-10-CM | POA: Diagnosis not present

## 2016-07-24 DIAGNOSIS — Z79899 Other long term (current) drug therapy: Secondary | ICD-10-CM

## 2016-07-24 DIAGNOSIS — M48061 Spinal stenosis, lumbar region without neurogenic claudication: Secondary | ICD-10-CM | POA: Diagnosis not present

## 2016-07-24 DIAGNOSIS — G8929 Other chronic pain: Secondary | ICD-10-CM | POA: Diagnosis not present

## 2016-07-24 DIAGNOSIS — M4316 Spondylolisthesis, lumbar region: Principal | ICD-10-CM | POA: Diagnosis present

## 2016-07-24 DIAGNOSIS — Z87891 Personal history of nicotine dependence: Secondary | ICD-10-CM

## 2016-07-24 DIAGNOSIS — J309 Allergic rhinitis, unspecified: Secondary | ICD-10-CM | POA: Diagnosis not present

## 2016-07-24 DIAGNOSIS — Z981 Arthrodesis status: Secondary | ICD-10-CM

## 2016-07-24 DIAGNOSIS — Z419 Encounter for procedure for purposes other than remedying health state, unspecified: Secondary | ICD-10-CM

## 2016-07-24 DIAGNOSIS — I10 Essential (primary) hypertension: Secondary | ICD-10-CM | POA: Diagnosis not present

## 2016-07-24 SURGERY — POSTERIOR LUMBAR FUSION 1 LEVEL
Anesthesia: General | Site: Spine Lumbar

## 2016-07-24 MED ORDER — BUPIVACAINE HCL (PF) 0.25 % IJ SOLN
INTRAMUSCULAR | Status: DC | PRN
  Administered 2016-07-24: 16 mL

## 2016-07-24 MED ORDER — ONDANSETRON HCL 4 MG/2ML IJ SOLN
INTRAMUSCULAR | Status: DC | PRN
  Administered 2016-07-24: 4 mg via INTRAVENOUS

## 2016-07-24 MED ORDER — FENTANYL CITRATE (PF) 100 MCG/2ML IJ SOLN
25.0000 ug | INTRAMUSCULAR | Status: DC | PRN
  Administered 2016-07-24: 50 ug via INTRAVENOUS

## 2016-07-24 MED ORDER — EPHEDRINE 5 MG/ML INJ
INTRAVENOUS | Status: AC
  Filled 2016-07-24: qty 10

## 2016-07-24 MED ORDER — CEFAZOLIN SODIUM-DEXTROSE 2-3 GM-% IV SOLR
INTRAVENOUS | Status: DC | PRN
  Administered 2016-07-24: 2 g via INTRAVENOUS

## 2016-07-24 MED ORDER — SODIUM CHLORIDE 0.9% FLUSH: 3.0000 mL | INTRAVENOUS | Status: DC | PRN

## 2016-07-24 MED ORDER — CHLORHEXIDINE GLUCONATE CLOTH 2 % EX PADS: 6.0000 | MEDICATED_PAD | Freq: Once | CUTANEOUS | Status: DC

## 2016-07-24 MED ORDER — SUGAMMADEX SODIUM 200 MG/2ML IV SOLN: INTRAVENOUS | Status: DC | PRN

## 2016-07-24 MED ORDER — VANCOMYCIN HCL 1000 MG IV SOLR
INTRAVENOUS | Status: AC
  Filled 2016-07-24: qty 1000

## 2016-07-24 MED ORDER — SUGAMMADEX SODIUM 200 MG/2ML IV SOLN
INTRAVENOUS | Status: AC
  Filled 2016-07-24: qty 2

## 2016-07-24 MED ORDER — SODIUM CHLORIDE 0.9 % IR SOLN
Status: DC | PRN
  Administered 2016-07-24: 14:00:00

## 2016-07-24 MED ORDER — FENTANYL CITRATE (PF) 250 MCG/5ML IJ SOLN
INTRAMUSCULAR | Status: DC | PRN
  Administered 2016-07-24: 150 ug via INTRAVENOUS
  Administered 2016-07-24: 50 ug via INTRAVENOUS

## 2016-07-24 MED ORDER — ACETAMINOPHEN 650 MG RE SUPP: 650.0000 mg | RECTAL | Status: DC | PRN

## 2016-07-24 MED ORDER — MIDAZOLAM HCL 2 MG/2ML IJ SOLN
INTRAMUSCULAR | Status: AC
  Filled 2016-07-24: qty 2

## 2016-07-24 MED ORDER — PROPOFOL 10 MG/ML IV BOLUS
INTRAVENOUS | Status: AC
  Filled 2016-07-24: qty 20

## 2016-07-24 MED ORDER — PREGABALIN 75 MG PO CAPS
75.0000 mg | ORAL_CAPSULE | Freq: Two times a day (BID) | ORAL | Status: DC
  Administered 2016-07-24: 75 mg via ORAL
  Filled 2016-07-24: qty 1

## 2016-07-24 MED ORDER — DEXAMETHASONE SODIUM PHOSPHATE 10 MG/ML IJ SOLN
10.0000 mg | INTRAMUSCULAR | Status: AC
  Administered 2016-07-24: 10 mg via INTRAVENOUS
  Filled 2016-07-24: qty 1

## 2016-07-24 MED ORDER — ACETAMINOPHEN 325 MG PO TABS
650.0000 mg | ORAL_TABLET | ORAL | Status: DC | PRN
  Administered 2016-07-24: 650 mg via ORAL
  Filled 2016-07-24: qty 2

## 2016-07-24 MED ORDER — FENTANYL CITRATE (PF) 100 MCG/2ML IJ SOLN
INTRAMUSCULAR | Status: AC
  Filled 2016-07-24: qty 2

## 2016-07-24 MED ORDER — METHOCARBAMOL 500 MG PO TABS
500.0000 mg | ORAL_TABLET | Freq: Four times a day (QID) | ORAL | Status: DC | PRN
  Administered 2016-07-24 – 2016-07-25 (×3): 500 mg via ORAL
  Filled 2016-07-24 (×3): qty 1

## 2016-07-24 MED ORDER — ROCURONIUM BROMIDE 10 MG/ML (PF) SYRINGE
PREFILLED_SYRINGE | INTRAVENOUS | Status: DC | PRN
Start: 2016-07-24 — End: 2016-07-24
  Administered 2016-07-24: 10 mg via INTRAVENOUS
  Administered 2016-07-24: 50 mg via INTRAVENOUS
  Administered 2016-07-24: 10 mg via INTRAVENOUS
  Administered 2016-07-24: 20 mg via INTRAVENOUS
  Administered 2016-07-24: 30 mg via INTRAVENOUS
  Administered 2016-07-24 (×2): 20 mg via INTRAVENOUS

## 2016-07-24 MED ORDER — THROMBIN 5000 UNITS EX SOLR
CUTANEOUS | Status: AC
  Filled 2016-07-24: qty 5000

## 2016-07-24 MED ORDER — BUPIVACAINE HCL (PF) 0.25 % IJ SOLN
INTRAMUSCULAR | Status: AC
  Filled 2016-07-24: qty 30

## 2016-07-24 MED ORDER — POTASSIUM CHLORIDE IN NACL 20-0.9 MEQ/L-% IV SOLN
INTRAVENOUS | Status: DC
  Filled 2016-07-24: qty 1000

## 2016-07-24 MED ORDER — METHOCARBAMOL 1000 MG/10ML IJ SOLN
500.0000 mg | Freq: Four times a day (QID) | INTRAVENOUS | Status: DC | PRN
  Filled 2016-07-24: qty 5

## 2016-07-24 MED ORDER — ONDANSETRON HCL 4 MG PO TABS
4.0000 mg | ORAL_TABLET | Freq: Four times a day (QID) | ORAL | Status: DC | PRN
  Administered 2016-07-24 – 2016-07-25 (×3): 4 mg via ORAL
  Filled 2016-07-24 (×3): qty 1

## 2016-07-24 MED ORDER — CELECOXIB 200 MG PO CAPS
200.0000 mg | ORAL_CAPSULE | Freq: Two times a day (BID) | ORAL | Status: DC
  Filled 2016-07-24: qty 1

## 2016-07-24 MED ORDER — SODIUM CHLORIDE 0.9 % IV SOLN: 250.0000 mL | INTRAVENOUS | Status: DC

## 2016-07-24 MED ORDER — CEFAZOLIN SODIUM-DEXTROSE 2-4 GM/100ML-% IV SOLN
2.0000 g | Freq: Three times a day (TID) | INTRAVENOUS | Status: AC
  Administered 2016-07-24 – 2016-07-25 (×2): 2 g via INTRAVENOUS
  Filled 2016-07-24 (×2): qty 100

## 2016-07-24 MED ORDER — KETOROLAC TROMETHAMINE 30 MG/ML IJ SOLN
INTRAMUSCULAR | Status: DC | PRN
  Administered 2016-07-24: 30 mg via INTRAVENOUS

## 2016-07-24 MED ORDER — GLYCOPYRROLATE 0.2 MG/ML IJ SOLN
INTRAMUSCULAR | Status: DC | PRN
  Administered 2016-07-24: 0.2 mg via INTRAVENOUS

## 2016-07-24 MED ORDER — SODIUM CHLORIDE 0.9% FLUSH
3.0000 mL | Freq: Two times a day (BID) | INTRAVENOUS | Status: DC
  Administered 2016-07-24 (×2): 3 mL via INTRAVENOUS

## 2016-07-24 MED ORDER — MIDAZOLAM HCL 2 MG/2ML IJ SOLN
INTRAMUSCULAR | Status: DC | PRN
  Administered 2016-07-24: 2 mg via INTRAVENOUS

## 2016-07-24 MED ORDER — PHENOL 1.4 % MT LIQD: 1.0000 | OROMUCOSAL | Status: DC | PRN

## 2016-07-24 MED ORDER — SUGAMMADEX SODIUM 200 MG/2ML IV SOLN
INTRAVENOUS | Status: DC | PRN
  Administered 2016-07-24: 200 mg via INTRAVENOUS

## 2016-07-24 MED ORDER — VANCOMYCIN HCL 1000 MG IV SOLR
INTRAVENOUS | Status: DC | PRN
Start: 2016-07-24 — End: 2016-07-24
  Administered 2016-07-24: 1000 mg via TOPICAL

## 2016-07-24 MED ORDER — CEFAZOLIN SODIUM-DEXTROSE 2-4 GM/100ML-% IV SOLN
2.0000 g | INTRAVENOUS | Status: DC
  Filled 2016-07-24: qty 100

## 2016-07-24 MED ORDER — EPHEDRINE SULFATE-NACL 50-0.9 MG/10ML-% IV SOSY
PREFILLED_SYRINGE | INTRAVENOUS | Status: DC | PRN
  Administered 2016-07-24: 10 mg via INTRAVENOUS

## 2016-07-24 MED ORDER — PROPOFOL 10 MG/ML IV BOLUS
INTRAVENOUS | Status: DC | PRN
Start: 2016-07-24 — End: 2016-07-24
  Administered 2016-07-24: 200 mg via INTRAVENOUS

## 2016-07-24 MED ORDER — THROMBIN 20000 UNITS EX SOLR
CUTANEOUS | Status: AC
  Filled 2016-07-24: qty 20000

## 2016-07-24 MED ORDER — TESTOSTERONE 50 MG/5GM (1%) TD GEL: 5.0000 g | Freq: Every day | TRANSDERMAL | Status: DC

## 2016-07-24 MED ORDER — THROMBIN 20000 UNITS EX SOLR
CUTANEOUS | Status: DC | PRN
  Administered 2016-07-24: 14:00:00 via TOPICAL

## 2016-07-24 MED ORDER — ONDANSETRON HCL 4 MG/2ML IJ SOLN
4.0000 mg | Freq: Four times a day (QID) | INTRAMUSCULAR | Status: DC | PRN
  Administered 2016-07-24: 4 mg via INTRAVENOUS
  Filled 2016-07-24: qty 2

## 2016-07-24 MED ORDER — FENTANYL CITRATE (PF) 250 MCG/5ML IJ SOLN
INTRAMUSCULAR | Status: AC
  Filled 2016-07-24: qty 5

## 2016-07-24 MED ORDER — OXYCODONE HCL 5 MG PO TABS
5.0000 mg | ORAL_TABLET | ORAL | Status: DC | PRN
  Administered 2016-07-24 – 2016-07-25 (×5): 10 mg via ORAL
  Filled 2016-07-24 (×5): qty 2

## 2016-07-24 MED ORDER — LACTATED RINGERS IV SOLN
INTRAVENOUS | Status: DC
  Administered 2016-07-24 (×2): via INTRAVENOUS

## 2016-07-24 MED ORDER — LIDOCAINE HCL (CARDIAC) 20 MG/ML IV SOLN
INTRAVENOUS | Status: AC
  Filled 2016-07-24: qty 5

## 2016-07-24 MED ORDER — MENTHOL 3 MG MT LOZG: 1.0000 | LOZENGE | OROMUCOSAL | Status: DC | PRN

## 2016-07-24 MED ORDER — MEPERIDINE HCL 25 MG/ML IJ SOLN
6.2500 mg | INTRAMUSCULAR | Status: DC | PRN
Start: 2016-07-24 — End: 2016-07-24

## 2016-07-24 MED ORDER — SCOPOLAMINE 1 MG/3DAYS TD PT72
1.0000 | MEDICATED_PATCH | TRANSDERMAL | Status: DC
  Administered 2016-07-24: 1.5 mg via TRANSDERMAL
  Filled 2016-07-24: qty 1

## 2016-07-24 MED ORDER — THROMBIN 5000 UNITS EX SOLR
CUTANEOUS | Status: DC | PRN
  Administered 2016-07-24: 14:00:00 via TOPICAL

## 2016-07-24 MED ORDER — PROMETHAZINE HCL 25 MG/ML IJ SOLN: 6.2500 mg | INTRAMUSCULAR | Status: DC | PRN

## 2016-07-24 MED ORDER — 0.9 % SODIUM CHLORIDE (POUR BTL) OPTIME
TOPICAL | Status: DC | PRN
  Administered 2016-07-24: 1000 mL

## 2016-07-24 MED ORDER — SENNA 8.6 MG PO TABS
1.0000 | ORAL_TABLET | Freq: Two times a day (BID) | ORAL | Status: DC
  Administered 2016-07-24: 8.6 mg via ORAL
  Filled 2016-07-24 (×2): qty 1

## 2016-07-24 MED ORDER — MORPHINE SULFATE (PF) 4 MG/ML IV SOLN
2.0000 mg | INTRAVENOUS | Status: DC | PRN
  Administered 2016-07-24: 2 mg via INTRAVENOUS
  Filled 2016-07-24: qty 1

## 2016-07-24 MED ORDER — LIDOCAINE 2% (20 MG/ML) 5 ML SYRINGE
INTRAMUSCULAR | Status: DC | PRN
  Administered 2016-07-24: 100 mg via INTRAVENOUS

## 2016-07-24 SURGICAL SUPPLY — 55 items
BAG DECANTER FOR FLEXI CONT (MISCELLANEOUS) ×2 IMPLANT
BASKET BONE COLLECTION (BASKET) ×2 IMPLANT
BENZOIN TINCTURE PRP APPL 2/3 (GAUZE/BANDAGES/DRESSINGS) ×2 IMPLANT
BLADE CLIPPER SURG (BLADE) IMPLANT
BONE CANC CHIPS 20CC PCAN1/4 (Bone Implant) ×2 IMPLANT
BUR MATCHSTICK NEURO 3.0 LAGG (BURR) ×2 IMPLANT
CANISTER SUCT 3000ML PPV (MISCELLANEOUS) ×2 IMPLANT
CARTRIDGE OIL MAESTRO DRILL (MISCELLANEOUS) ×1 IMPLANT
CHIPS CANC BONE 20CC PCAN1/4 (Bone Implant) ×1 IMPLANT
CONT SPEC 4OZ CLIKSEAL STRL BL (MISCELLANEOUS) ×2 IMPLANT
COVER BACK TABLE 60X90IN (DRAPES) ×2 IMPLANT
DIFFUSER DRILL AIR PNEUMATIC (MISCELLANEOUS) ×2 IMPLANT
DRAPE C-ARM 42X72 X-RAY (DRAPES) ×4 IMPLANT
DRAPE LAPAROTOMY 100X72X124 (DRAPES) ×2 IMPLANT
DRAPE POUCH INSTRU U-SHP 10X18 (DRAPES) ×2 IMPLANT
DRAPE SURG 17X23 STRL (DRAPES) ×2 IMPLANT
DRSG OPSITE POSTOP 4X6 (GAUZE/BANDAGES/DRESSINGS) ×2 IMPLANT
DURAPREP 26ML APPLICATOR (WOUND CARE) ×2 IMPLANT
ELECT REM PT RETURN 9FT ADLT (ELECTROSURGICAL) ×2
ELECTRODE REM PT RTRN 9FT ADLT (ELECTROSURGICAL) ×1 IMPLANT
EVACUATOR 1/8 PVC DRAIN (DRAIN) ×2 IMPLANT
GAUZE SPONGE 4X4 16PLY XRAY LF (GAUZE/BANDAGES/DRESSINGS) IMPLANT
GLOVE BIO SURGEON STRL SZ7 (GLOVE) IMPLANT
GLOVE BIO SURGEON STRL SZ8 (GLOVE) ×4 IMPLANT
GLOVE BIOGEL PI IND STRL 7.0 (GLOVE) IMPLANT
GLOVE BIOGEL PI INDICATOR 7.0 (GLOVE)
GOWN STRL REUS W/ TWL LRG LVL3 (GOWN DISPOSABLE) IMPLANT
GOWN STRL REUS W/ TWL XL LVL3 (GOWN DISPOSABLE) ×2 IMPLANT
GOWN STRL REUS W/TWL 2XL LVL3 (GOWN DISPOSABLE) IMPLANT
GOWN STRL REUS W/TWL LRG LVL3 (GOWN DISPOSABLE)
GOWN STRL REUS W/TWL XL LVL3 (GOWN DISPOSABLE) ×2
HEMOSTAT POWDER KIT SURGIFOAM (HEMOSTASIS) IMPLANT
KIT BASIN OR (CUSTOM PROCEDURE TRAY) ×2 IMPLANT
KIT ROOM TURNOVER OR (KITS) ×2 IMPLANT
NEEDLE HYPO 25X1 1.5 SAFETY (NEEDLE) ×2 IMPLANT
NS IRRIG 1000ML POUR BTL (IV SOLUTION) ×2 IMPLANT
OIL CARTRIDGE MAESTRO DRILL (MISCELLANEOUS) ×2
PACK LAMINECTOMY NEURO (CUSTOM PROCEDURE TRAY) ×2 IMPLANT
PAD ARMBOARD 7.5X6 YLW CONV (MISCELLANEOUS) ×6 IMPLANT
ROD PC 5.5X40 TI ARSENAL (Rod) ×4 IMPLANT
SCREW CBX 5.5X35MM ARSENAL (Screw) ×4 IMPLANT
SCREW CORT CANC 5.5X40 (Screw) ×4 IMPLANT
SCREW SET SPINAL ARSENAL 47127 (Screw) ×8 IMPLANT
SPACER PEEK PS 25X8MM 9MM 5DEG (Spacer) ×4 IMPLANT
SPONGE LAP 4X18 X RAY DECT (DISPOSABLE) IMPLANT
SPONGE SURGIFOAM ABS GEL 100 (HEMOSTASIS) ×2 IMPLANT
STRIP CLOSURE SKIN 1/2X4 (GAUZE/BANDAGES/DRESSINGS) ×2 IMPLANT
SUT VIC AB 0 CT1 18XCR BRD8 (SUTURE) ×1 IMPLANT
SUT VIC AB 0 CT1 8-18 (SUTURE) ×1
SUT VIC AB 2-0 CP2 18 (SUTURE) ×2 IMPLANT
SUT VIC AB 3-0 SH 8-18 (SUTURE) ×4 IMPLANT
TOWEL GREEN STERILE (TOWEL DISPOSABLE) ×2 IMPLANT
TOWEL GREEN STERILE FF (TOWEL DISPOSABLE) ×2 IMPLANT
TRAY FOLEY W/METER SILVER 16FR (SET/KITS/TRAYS/PACK) ×2 IMPLANT
WATER STERILE IRR 1000ML POUR (IV SOLUTION) ×2 IMPLANT

## 2016-07-24 NOTE — Op Note (Signed)
07/24/2016  2:54 PM  PATIENT:  Joshua Norris  51 y.o. 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 Decompressive lumbar laminectomy L4-5 (Gill-type) requiring more work than would be required for a simple exposure of the disk for PLIF in order to adequately decompress the neural elements and address the spinal stenosis 2. Posterior lumbar interbody fusion L4-5 using PEEK interbody cages packed with morcellized allograft and autograft 3. Posterior fixation L4-5 using atec cortical pedicle screws.  4. Intertransverse arthrodesis L4-5 using morcellized autograft and allograft.  SURGEON:  Sherley Bounds, MD  ASSISTANTS: Dr. Vertell Limber  ANESTHESIA:  General  EBL: 100 ml  Total I/O In: 1000 [I.V.:1000] Out: 100 [Blood:100]  BLOOD ADMINISTERED:none  DRAINS: none   INDICATION FOR PROCEDURE: This patient presented with chronic back and leg pain. Imaging revealed a lytic spondylolisthesis with stenosis L4-5. The patient tried a reasonable attempt at conservative medical measures without relief. I recommended decompression and instrumented fusion to address the stenosis as well as the segmental stability.  Patient understood the risks, benefits, and alternatives and potential outcomes and wished to proceed.  PROCEDURE DETAILS:  The patient was brought to the operating room. After induction of generalized endotracheal anesthesia the patient was rolled into the prone position on chest rolls and all pressure points were padded. The patient's lumbar region was cleaned and then prepped with DuraPrep and draped in the usual sterile fashion. Anesthesia was injected and then a dorsal midline incision was made and carried down to the lumbosacral fascia. The fascia was opened and the paraspinous musculature was taken down in a subperiosteal fashion to expose L4-5. A self-retaining retractor was placed.  Intraoperative fluoroscopy confirmed my level, and I started with placement of the L4 cortical pedicle screws. The pedicle screw entry zones were identified utilizing surface landmarks and  AP and lateral fluoroscopy. I scored the cortex with the high-speed drill and then used the hand drill  to drill an upward and outward direction into the pedicle. I then tapped line to line, and the tap was also monitored. I then placed a 5.5 x 35 mm cortical pedicle screw into the pedicles of L4 bilaterally. I then turned my attention to the decompression and complete lumbar laminectomies, hemi- facetectomies, and foraminotomies were performed at L4-5. He had bilateral pars defects and therefore a full Gill decompression was performed. The patient had significant spinal stenosis and this required more work than would be required for a simple exposure of the disc for posterior lumbar interbody fusion. Much more generous decompression was undertaken in order to adequately decompress the neural elements and address the patient's leg pain. The yellow ligament was removed to expose the underlying dura and nerve roots, and generous foraminotomies were performed to adequately decompress the neural elements. Both the exiting and traversing nerve roots were decompressed on both sides until a coronary dilator passed easily along the nerve roots. Once the decompression was complete, I turned my attention to the posterior lower lumbar interbody fusion. The epidural venous vasculature was coagulated and cut sharply. Disc space was incised and the initial discectomy was performed with pituitary rongeurs. The disc space was distracted with sequential distractors to a height of 8 mm. We then used a series of scrapers and shavers to prepare the endplates for fusion. The midline was prepared with Epstein curettes. Once the complete discectomy was finished, we packed an appropriate sized peek interbody cage  with local autograft and morcellized  allograft, gently retracted the nerve root, and tapped the cage into position at L4-5.  The midline between the cages was packed with morselized autograft and allograft. We then turned our attention to the placement of the lower pedicle screws. The pedicle screw entry zones were identified utilizing surface landmarks and fluoroscopy. I drilled into each pedicle utilizing the hand drill and EMG monitoring, and tapped each pedicle with the appropriate tap. We palpated with a ball probe to assure no break in the cortex. We then placed 5.5 x 35 mm cortical pedicle screws into the pedicles bilaterally at L5. We then decorticated the transverse processes and laid a mixture of morcellized autograft and allograft out over these to perform intertransverse arthrodesis at L4-5. We then placed lordotic rods into the multiaxial screw heads of the pedicle screws and locked these in position with the locking caps and anti-torque device. We then checked our construct with AP and lateral fluoroscopy. Irrigated with copious amounts of bacitracin-containing saline solution. Inspected the nerve roots once again to assure adequate decompression, lined to the dura with Gelfoam, and closed the muscle and the fascia with 0 Vicryl. Closed the subcutaneous tissues with 2-0 Vicryl and subcuticular tissues with 3-0 Vicryl. The skin was closed with benzoin and Steri-Strips. Dressing was then applied, the patient was awakened from general anesthesia and transported to the recovery room in stable condition. At the end of the procedure all sponge, needle and instrument counts were correct.   PLAN OF CARE: admit to inpatient  PATIENT DISPOSITION:  PACU - hemodynamically stable.   Delay start of Pharmacological VTE agent (>24hrs) due to surgical blood loss or risk of bleeding:  yes

## 2016-07-24 NOTE — Anesthesia Preprocedure Evaluation (Addendum)
Anesthesia Evaluation  Patient identified by MRN, date of birth, ID band Patient awake    Reviewed: Allergy & Precautions, NPO status , Patient's Chart, lab work & pertinent test results  History of Anesthesia Complications (+) PONV, Family history of anesthesia reaction and history of anesthetic complications  Airway Mallampati: II  TM Distance: >3 FB Neck ROM: Full    Dental no notable dental hx. (+) Teeth Intact, Dental Advisory Given   Pulmonary asthma ,    Pulmonary exam normal breath sounds clear to auscultation       Cardiovascular hypertension, Normal cardiovascular exam Rhythm:Regular Rate:Normal     Neuro/Psych negative neurological ROS  negative psych ROS   GI/Hepatic negative GI ROS, Neg liver ROS,   Endo/Other  negative endocrine ROS  Renal/GU negative Renal ROS  negative genitourinary   Musculoskeletal  (+) Arthritis , Spondylolisthesis L4-L5   Abdominal   Peds  Hematology negative hematology ROS (+)   Anesthesia Other Findings   Reproductive/Obstetrics                            Anesthesia Physical Anesthesia Plan  ASA: II  Anesthesia Plan: General   Post-op Pain Management:    Induction:   PONV Risk Score and Plan: 4 or greater and Ondansetron, Dexamethasone, Propofol, Midazolam and Scopolamine patch - Pre-op  Airway Management Planned: Oral ETT  Additional Equipment:   Intra-op Plan:   Post-operative Plan:   Informed Consent: I have reviewed the patients History and Physical, chart, labs and discussed the procedure including the risks, benefits and alternatives for the proposed anesthesia with the patient or authorized representative who has indicated his/her understanding and acceptance.   Dental advisory given  Plan Discussed with: CRNA, Anesthesiologist and Surgeon  Anesthesia Plan Comments:         Anesthesia Quick Evaluation

## 2016-07-24 NOTE — Anesthesia Procedure Notes (Signed)
Procedure Name: Intubation Date/Time: 07/24/2016 11:53 AM Performed by: Julieta Bellini Pre-anesthesia Checklist: Patient identified, Emergency Drugs available, Suction available and Patient being monitored Patient Re-evaluated:Patient Re-evaluated prior to inductionOxygen Delivery Method: Circle system utilized Preoxygenation: Pre-oxygenation with 100% oxygen Intubation Type: IV induction Ventilation: Mask ventilation without difficulty Laryngoscope Size: Mac Grade View: Grade I Tube type: Oral Tube size: 7.5 mm Number of attempts: 1 Airway Equipment and Method: Stylet Placement Confirmation: ETT inserted through vocal cords under direct vision,  positive ETCO2 and breath sounds checked- equal and bilateral Secured at: 23 cm Tube secured with: Tape Dental Injury: Teeth and Oropharynx as per pre-operative assessment

## 2016-07-24 NOTE — Anesthesia Postprocedure Evaluation (Signed)
Anesthesia Post Note  Patient: Joshua Norris  Procedure(s) Performed: Procedure(s) (LRB): LUMBAR FOUR-FIVE POSTERIOR LUMBAR INTERBODY FUSION (N/A)     Patient location during evaluation: PACU Anesthesia Type: General Level of consciousness: awake and alert Pain management: pain level controlled Vital Signs Assessment: post-procedure vital signs reviewed and stable Respiratory status: spontaneous breathing, nonlabored ventilation and respiratory function stable Cardiovascular status: blood pressure returned to baseline and stable Postop Assessment: no signs of nausea or vomiting Anesthetic complications: no    Last Vitals:  Vitals:   07/24/16 1510 07/24/16 1524  BP: 124/63 119/69  Pulse: (!) 57 (!) 55  Resp: 12 12  Temp:  36.5 C    Last Pain:  Vitals:   07/24/16 1517  TempSrc:   PainSc: 6                  Louetta Hollingshead,Rudolf A.

## 2016-07-24 NOTE — H&P (Signed)
Subjective: Patient is a 51 y.o. male admitted for PLIF L4-5. Onset of symptoms was a few years ago, gradually worsening since that time.  The pain is rated severe, unremitting, and is located at the across the lower back and radiates to legs. The pain is described as aching and occurs all day. The symptoms have been progressive. Symptoms are exacerbated by exercise. 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 Arthritis    Joints  . Asthma    infant  . 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 Complication of anesthesia    n/v  . Family history of adverse reaction to anesthesia    Mother- N/V  . Lipoma 10/27/2008   Excised from behind R arm    . Seasonal allergies     Past Surgical History:  Procedure Laterality Date  . COLONOSCOPY    . COLONOSCOPY W/ POLYPECTOMY     age 48  . HIP SURGERY Right 2007   right; torn labrum  . ROTATOR CUFF REPAIR Right    avulsed infraspinatus and supraspinatus    Prior to Admission medications   Medication Sig Start Date End Date Taking? Authorizing Provider  B Complex Vitamins (B COMPLEX-B12 PO) Take 1 tablet by mouth daily.    Yes [provider]  cholecalciferol (VITAMIN D) 1000 UNITS tablet Take 1,000 Units by mouth daily.   Yes [provider]  Flaxseed, Linseed, (FLAX SEEDS PO) Take 1 tablet by mouth daily.    Yes [provider]  Ginkgo Biloba (GINKOBA PO) Take 1 tablet by mouth daily.    Yes [provider]  glucosamine-chondroitin 500-400 MG tablet Take 2 tablets by mouth daily.    Yes [provider]  GRAPE SEED EXTRACT PO Take 1 tablet by mouth daily.    Yes [provider]  ibuprofen (ADVIL,MOTRIN) 200 MG tablet Take 800 mg by mouth 2 (two) times daily.   Yes [provider]  MAGNESIUM OXIDE PO Take 1 tablet by mouth daily.   Yes [provider]  Misc Natural Products (BEE PROPOLIS PO) Take 1,000 mg by mouth 2 (two) times daily.    Yes [provider]  oxymetazoline (AFRIN) 0.05 % nasal spray Place 1 spray into both nostrils 2 (two) times daily as needed for congestion.   Yes [provider]  pregabalin (LYRICA) 75 MG capsule Take 75 mg by mouth 2 (two) times daily.   Yes [provider]  testosterone (ANDROGEL) 50 MG/5GM (1%) GEL APPLY 1 PACKET TO SKIN DAILY 04/22/16  Yes Marin Olp, MD   Allergies  Allergen Reactions  . Percocet [Oxycodone-Acetaminophen] Nausea And Vomiting    Unsure if anesthesia or Percocet  . Tramadol Nausea And Vomiting  . Dilaudid [Hydromorphone Hcl] Nausea And Vomiting  . Latex Other (See Comments)    Canker sores from dental procedures from gloves  . Other Nausea And Vomiting    Severe nausea /vomiting from anesthesia or dilaudid.  Not sure which one caused it.     Social History  Substance Use Topics  . Smoking status: Never Smoker  . Smokeless tobacco: Former Systems developer    Quit date: 09/28/2007     Comment: chewed tobacco for 27 years  . Alcohol use 1.8 oz/week    3 Cans of beer per week    Family History  Problem Relation Age of Onset  . Hypertension Mother   . Sleep  apnea Mother        overweight  . Emphysema Mother        smoker  . Colon cancer Father 7       end stage. died age 84 in 2014/04/20.   Marland Kitchen Pancreatic cancer Father 59       asymptomatic at presentation  . Prostate cancer Father 64  . Melanoma Father 69  . Prostate cancer Brother 91       now metastatic  . Stomach cancer Neg Hx      Review of Systems  Positive ROS: neg  All other systems have been reviewed and were otherwise negative with the exception of those mentioned in the HPI and as above.  Objective: Vital signs in last 24 hours: Temp:  [97.9 F (36.6 C)] 97.9 F (36.6 C) (07/05 0921) Pulse Rate:  [52] 52 (07/05 0921) Resp:  [18] 18 (07/05 0921) BP: (129)/(80) 129/80 (07/05 0921) SpO2:  [99 %] 99 % (07/05 0921) Weight:  [80.7 kg (178 lb)] 80.7 kg (178 lb) (07/05  0900)  General Appearance: Alert, cooperative, no distress, appears stated age Head: Normocephalic, without obvious abnormality, atraumatic Eyes: PERRL, conjunctiva/corneas clear, EOM's intact    Neck: Supple, symmetrical, trachea midline Back: Symmetric, no curvature, ROM normal, no CVA tenderness Lungs:  respirations unlabored Heart: Regular rate and rhythm Abdomen: Soft, non-tender Extremities: Extremities normal, atraumatic, no cyanosis or edema Pulses: 2+ and symmetric all extremities Skin: Skin color, texture, turgor normal, no rashes or lesions  NEUROLOGIC:   Mental status: Alert and oriented x4,  no aphasia, good attention span, fund of knowledge, and memory Motor Exam - grossly normal Sensory Exam - grossly normal Reflexes: 1+ Coordination - grossly normal Gait - grossly normal Balance - grossly normal Cranial Nerves: I: smell Not tested  II: visual acuity  OS: nl    OD: nl  II: visual fields Full to confrontation  II: pupils Equal, round, reactive to light  III,VII: ptosis None  III,IV,VI: extraocular muscles  Full ROM  V: mastication Normal  V: facial light touch sensation  Normal  V,VII: corneal reflex  Present  VII: facial muscle function - upper  Normal  VII: facial muscle function - lower Normal  VIII: hearing Not tested  IX: soft palate elevation  Normal  IX,X: gag reflex Present  XI: trapezius strength  5/5  XI: sternocleidomastoid strength 5/5  XI: neck flexion strength  5/5  XII: tongue strength  Normal    Data Review Lab Results  Component Value Date   WBC 6.1 07/15/2016   HGB 16.1 07/15/2016   HCT 45.9 07/15/2016   MCV 85.8 07/15/2016   PLT 221 07/15/2016   Lab Results  Component Value Date   NA 138 07/15/2016   K 4.3 07/15/2016   CL 103 07/15/2016   CO2 28 07/15/2016   BUN 19 07/15/2016   CREATININE 1.08 07/15/2016   GLUCOSE 102 (H) 07/15/2016   Lab Results  Component Value Date   INR 0.91 07/15/2016     Assessment/Plan: Patient admitted for PLIF L4-5. Patient has failed a reasonable attempt at conservative therapy.  I explained the condition and procedure to the patient and answered any questions.  Patient wishes to proceed with procedure as planned. Understands risks/ benefits and typical outcomes of procedure.   Jezreel Justiniano S 07/24/2016 11:25 AM

## 2016-07-24 NOTE — Transfer of Care (Signed)
Immediate Anesthesia Transfer of Care Note  Patient: Joshua Norris  Procedure(s) Performed: Procedure(s): LUMBAR FOUR-FIVE POSTERIOR LUMBAR INTERBODY FUSION (N/A)  Patient Location: PACU  Anesthesia Type:General  Level of Consciousness: awake and patient cooperative  Airway & Oxygen Therapy: Patient Spontanous Breathing and Patient connected to nasal cannula oxygen  Post-op Assessment: Report given to RN and Post -op Vital signs reviewed and stable  Post vital signs: Reviewed and stable  Last Vitals:  Vitals:   07/24/16 0921 07/24/16 1455  BP: 129/80   Pulse: (!) 52 (!) 58  Resp: 18 12  Temp: 36.6 C 36.5 C    Last Pain:  Vitals:   07/24/16 0921  TempSrc: Oral  PainSc:          Complications: No apparent anesthesia complications

## 2016-07-24 NOTE — OR Nursing (Signed)
Foley catheter was attempted to be inserted but would not advance far enough into bladder. Dr Ronnald Ramp elected to perform surgery without catheter after consulting with anesthesia. No obvious trauma to urethra noted.

## 2016-07-25 MED ORDER — ONDANSETRON HCL 4 MG PO TABS: 4.0000 mg | ORAL_TABLET | Freq: Four times a day (QID) | ORAL | 0 refills | Status: DC | PRN

## 2016-07-25 MED ORDER — METHOCARBAMOL 500 MG PO TABS: 500.0000 mg | ORAL_TABLET | Freq: Four times a day (QID) | ORAL | 1 refills | Status: DC | PRN

## 2016-07-25 MED ORDER — OXYCODONE HCL 5 MG PO TABS: 5.0000 mg | ORAL_TABLET | ORAL | 0 refills | Status: DC | PRN

## 2016-07-25 NOTE — Evaluation (Signed)
Physical Therapy Evaluation Patient Details Name: Joshua Norris MRN: [id number] DOB: 1965-06-04 Today's Date: 07/25/2016   History of Present Illness  Pt is a 51 y/o M s/p decompressive lumbar laminectomy, PLIF L4-L5  Clinical Impression  Patient evaluated by Physical Therapy with no further acute PT needs identified. All education has been completed and the patient has no further questions. Pt is supervision for bed mobility, transfers, ambulation of 500 feet and ascent/descent of 16 stairs. See below for any follow-up Physical Therapy or equipment needs. PT is signing off. Thank you for this referral.     Follow Up Recommendations No PT follow up    Equipment Recommendations  None recommended by PT    Recommendations for Other Services       Precautions / Restrictions Precautions Precautions: Back Precaution Booklet Issued: Yes (comment) Precaution Comments: Back precautions handout issued and reviewed  Spinal Brace: Lumbar corset;Applied in sitting position Restrictions Weight Bearing Restrictions: No      Mobility  Bed Mobility Overal bed mobility: Independent             General bed mobility comments: Pt demostrated proper log rolling and push up for supine to sit   Transfers Overall transfer level: Independent Equipment used: None             General transfer comment: supervision for safety with sit<>stand, good hand placement and power up  Ambulation/Gait Ambulation/Gait assistance: Independent Ambulation Distance (Feet): 500 Feet Assistive device: None Gait Pattern/deviations: Step-through pattern;Decreased stride length Gait velocity: slowed for patient Gait velocity interpretation: >2.62 ft/sec, indicative of independent community ambulator General Gait Details: supervision for ambulation, safe, steady gait, vc to watch twisting to carry on conversation while walking  Stairs Stairs: Yes Stairs assistance: Supervision Stair Management: One  rail Left;Forwards Number of Stairs: 16 General stair comments: slow, steady ascent/descent   Wheelchair Mobility    Modified Rankin (Stroke Patients Only)       Balance Overall balance assessment: No apparent balance deficits (not formally assessed)                                           Pertinent Vitals/Pain Pain Assessment: 0-10 Pain Score: 6  Faces Pain Scale: Hurts a little bit Pain Location: back (surgical pain) Pain Descriptors / Indicators: Sore Pain Intervention(s): Monitored during session  VSS    Home Living Family/patient expects to be discharged to:: Private residence Living Arrangements: Spouse/significant other Available Help at Discharge: Family;Available 24 hours/day (initially ) Type of Home: House Home Access: Stairs to enter   [employer] of Steps: 2 Home Layout: One level Home Equipment: None      Prior Function Level of Independence: Independent         Comments: Pt works as a IT sales professional Extremity Assessment Upper Extremity Assessment: Overall WFL for tasks assessed    Lower Extremity Assessment Lower Extremity Assessment: Overall WFL for tasks assessed    Cervical / Trunk Assessment Cervical / Trunk Assessment: Normal  Communication   Communication: No difficulties  Cognition Arousal/Alertness: Awake/alert Behavior During Therapy: WFL for tasks assessed/performed Overall Cognitive Status: Within Functional Limits for tasks assessed  General Comments General comments (skin integrity, edema, etc.): pt stated that he has surgical site pain but that his radicular pain in his R knee has subsided and he is very pleased with result        Assessment/Plan    PT Assessment Patent does not need any further PT services         PT Goals (Current goals can be found in the  Care Plan section)  Acute Rehab PT Goals Patient Stated Goal: get back to work      AM-PAC PT "6 Clicks" Daily Activity  Outcome Measure Difficulty turning over in bed (including adjusting bedclothes, sheets and blankets)?: None Difficulty moving from lying on back to sitting on the side of the bed? : None Difficulty sitting down on and standing up from a chair with arms (e.g., wheelchair, bedside commode, etc,.)?: None Help needed moving to and from a bed to chair (including a wheelchair)?: None Help needed walking in hospital room?: None Help needed climbing 3-5 steps with a railing? : None 6 Click Score: 24    End of Session Equipment Utilized During Treatment: Back brace Activity Tolerance: Patient tolerated treatment well Patient left: Other (comment);with nursing/sitter in room;with family/visitor present (seated EoB ) Nurse Communication: Mobility status PT Visit Diagnosis: Pain Pain - part of body:  (back surgical site)    Time: 0911-0930 PT Time Calculation (min) (ACUTE ONLY): 19 min   Charges:   PT Evaluation $PT Eval Low Complexity: 1 Procedure     PT G Codes:        Corrie Reder B. Migdalia Dk PT, DPT Acute Rehabilitation  [phone] Pager [phone]    [place] 07/25/2016, 10:03 AM

## 2016-07-25 NOTE — Discharge Summary (Signed)
Physician Discharge Summary  Patient ID: Joshua Norris MRN: [id number] DOB/AGE: [birth year] 51 y.o.  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 The patient tolerated the procedure well and was taken to the recovery room and then to the floor in stable condition. The hospital course was routine. There were no complications. The wound remained clean dry and intact. Pt had appropriate back soreness. No complaints of leg pain or new N/T/W. The patient remained afebrile with stable vital signs, and tolerated a regular diet. The patient continued to increase activities, and pain was well controlled with oral pain medications.   Consults: None  Significant Diagnostic Studies:  Results for orders placed or performed during the hospital encounter of 07/15/16  Surgical pcr screen  Result Value Ref Range   MRSA, PCR NEGATIVE NEGATIVE   Staphylococcus aureus NEGATIVE NEGATIVE  Basic metabolic panel  Result Value Ref Range   Sodium 138 135 - 145 mmol/L   Potassium 4.3 3.5 - 5.1 mmol/L   Chloride 103 101 - 111 mmol/L   CO2 28 22 - 32 mmol/L   Glucose, Bld 102 (H) 65 - 99 mg/dL   BUN 19 6 - 20 mg/dL   Creatinine, Ser 1.08 0.61 - 1.24 mg/dL   Calcium 9.4 8.9 - 10.3 mg/dL   GFR calc non Af Amer >60 >60 mL/min   GFR calc Af Amer >60 >60 mL/min   Anion gap 7 5 - 15  CBC WITH DIFFERENTIAL  Result Value Ref Range   WBC 6.1 4.0 - 10.5 K/uL   RBC 5.35 4.22 - 5.81 MIL/uL   Hemoglobin 16.1 13.0 - 17.0 g/dL   HCT 45.9 39.0 - 52.0 %   MCV 85.8 78.0 - 100.0 fL   MCH 30.1 26.0 - 34.0 pg   MCHC 35.1 30.0 - 36.0 g/dL   RDW 12.5 11.5 - 15.5 %   Platelets 221 150 - 400 K/uL   Neutrophils Relative % 62 %   Neutro Abs 3.8 1.7 - 7.7 K/uL   Lymphocytes Relative 26 %   Lymphs Abs 1.6 0.7 - 4.0 K/uL   Monocytes Relative 7 %   Monocytes Absolute 0.4 0.1 - 1.0 K/uL   Eosinophils Relative 4 %   Eosinophils Absolute 0.2 0.0 - 0.7 K/uL   Basophils Relative 1 %   Basophils Absolute 0.0 0.0 - 0.1 K/uL  Protime-INR  Result Value Ref Range   Prothrombin Time 12.3 11.4 - 15.2 seconds   INR 0.91   Type and screen [place]en  Result Value Ref Range   ABO/RH(D) A POS    Antibody Screen NEG    Sample Expiration 07/29/2016    Extend sample reason NO TRANSFUSIONS OR PREGNANCY IN THE PAST 3 MONTHS   ABO/Rh  Result Value Ref Range   ABO/RH(D) A POS     Chest 2 View  Result Date: 07/15/2016 CLINICAL DATA:  History asthma and skin cancer.  Preoperative study. EXAM: CHEST  2 VIEW COMPARISON:  No prior . FINDINGS: Mediastinum hilar structures normal. Lungs are clear. No pleural effusion or pneumothorax. Heart size normal. No acute bony abnormality. IMPRESSION: No acute cardiopulmonary disease. Electronically Signed   By: Marcello Moores  Register   On: 07/15/2016 15:11   Dg Lumbar Spine 2-3 Views  Result Date: 07/24/2016  CLINICAL DATA:  Surgery . EXAM: LUMBAR SPINE - 2-3 VIEW COMPARISON:  03/18/2016 . FINDINGS: Lumbar spine number with the lowest segmented lumbar shaped vertebra as L5. L4-L5 posterior and interbody fusion. Hardware intact. Stable anterolisthesis L4 on L5. No acute bony abnormality. IMPRESSION: Prior L4-L5 posterior and interbody fusion. Hardware intact. Stable anterolisthesis L4 on L5. Electronically Signed   By: Marcello Moores  Register   On: 07/24/2016 14:49   Dg C-arm 1-60 Min  Result Date: 07/24/2016 CLINICAL DATA:  Lumbar fusion . EXAM: DG C-ARM 61-120 MIN COMPARISON:  03/18/2016 . FINDINGS: Lumbar vertebra number with the lowest segmented appearing lumbar shaped vertebral lateral view as L5. L4-L5 posterior and interbody fusion. Hardware intact. Stable anterolisthesis L4 on L5. IMPRESSION: L4-L5 posterior and interbody fusion. Electronically Signed    By: Marcello Moores  Register   On: 07/24/2016 14:50    Antibiotics:  Anti-infectives    Start     Dose/Rate Route Frequency Ordered Stop   07/24/16 1930  ceFAZolin (ANCEF) IVPB 2g/100 mL premix     2 g 200 mL/hr over 30 Minutes Intravenous Every 8 hours 07/24/16 1540 07/25/16 0351   07/24/16 1435  vancomycin (VANCOCIN) powder  Status:  Discontinued       As needed 07/24/16 1447 07/24/16 1450   07/24/16 1337  bacitracin 50,000 Units in sodium chloride irrigation 0.9 % 500 mL irrigation  Status:  Discontinued       As needed 07/24/16 1338 07/24/16 1450   07/24/16 0849  ceFAZolin (ANCEF) IVPB 2g/100 mL premix  Status:  Discontinued     2 g 200 mL/hr over 30 Minutes Intravenous On call to O.R. 07/24/16 0849 07/24/16 1537      Discharge Exam: Blood pressure 130/77, pulse (!) 54, temperature 97.9 F (36.6 C), resp. rate 16, height 5\' 10"  (1.778 m), weight 80.7 kg (178 lb), SpO2 98 %. Neurologic: Grossly normal Dressing dry  Discharge Medications:   Allergies as of 07/25/2016      Reactions   Percocet [oxycodone-acetaminophen] Nausea And Vomiting   Unsure if anesthesia or Percocet   Tramadol Nausea And Vomiting   Dilaudid [hydromorphone Hcl] Nausea And Vomiting   Latex Other (See Comments)   Canker sores from dental procedures from gloves   Other Nausea And Vomiting   Severe nausea /vomiting from anesthesia or dilaudid.  Not sure which one caused it.       Medication List    TAKE these medications   B COMPLEX-B12 PO Take 1 tablet by mouth daily.   BEE PROPOLIS PO Take 1,000 mg by mouth 2 (two) times daily.   cholecalciferol 1000 units tablet Commonly known as:  VITAMIN D Take 1,000 Units by mouth daily.   FLAX SEEDS PO Take 1 tablet by mouth daily.   GINKOBA PO Take 1 tablet by mouth daily.   glucosamine-chondroitin 500-400 MG tablet Take 2 tablets by mouth daily.   GRAPE SEED EXTRACT PO Take 1 tablet by mouth daily.   ibuprofen 200 MG tablet Commonly known as:   ADVIL,MOTRIN Take 800 mg by mouth 2 (two) times daily.   MAGNESIUM OXIDE PO Take 1 tablet by mouth daily.   methocarbamol 500 MG tablet Commonly known as:  ROBAXIN Take 1 tablet (500 mg total) by mouth every 6 (six) hours as needed for muscle spasms.   ondansetron 4 MG tablet Commonly known as:  ZOFRAN Take 1 tablet (4 mg total) by mouth every 6 (six) hours as needed for nausea or vomiting.   oxyCODONE 5 MG  immediate release tablet Commonly known as:  Oxy IR/ROXICODONE Take 1-2 tablets (5-10 mg total) by mouth every 4 (four) hours as needed for breakthrough pain.   oxymetazoline 0.05 % nasal spray Commonly known as:  AFRIN Place 1 spray into both nostrils 2 (two) times daily as needed for congestion.   pregabalin 75 MG capsule Commonly known as:  LYRICA Take 75 mg by mouth 2 (two) times daily.   testosterone 50 MG/5GM (1%) Gel Commonly known as:  ANDROGEL APPLY 1 PACKET TO SKIN DAILY            Durable Medical Equipment        Start     Ordered   07/24/16 1541  DME Walker rolling  Once    Question:  Patient needs a walker to treat with the following condition  Answer:  S/P lumbar fusion   07/24/16 1540   07/24/16 1541  DME 3 n 1  Once     07/24/16 1540      Disposition: home   Final Dx: PLIF L4-5  Discharge Instructions     Remove dressing in 72 hours    Complete by:  As directed    Call MD for:  difficulty breathing, headache or visual disturbances    Complete by:  As directed    Call MD for:  persistant nausea and vomiting    Complete by:  As directed    Call MD for:  redness, tenderness, or signs of infection (pain, swelling, redness, odor or green/yellow discharge around incision site)    Complete by:  As directed    Call MD for:  severe uncontrolled pain    Complete by:  As directed    Call MD for:  temperature >100.4    Complete by:  As directed    Diet - low sodium heart healthy    Complete by:  As directed    Increase activity slowly     Complete by:  As directed          Signed: JONES,DAVID S 07/25/2016, 8:17 AM

## 2016-07-25 NOTE — Progress Notes (Signed)
Patient is discharged from room 3C09 at this time. Alert and in stable condition. IV site d/c'd and report given to patient and wife with understanding verbalized. Left unit via wheelchair with all belongings at side.

## 2016-07-25 NOTE — Evaluation (Signed)
Occupational Therapy Evaluation Patient Details Name: Joshua Norris MRN: [id number] DOB: 30-Jan-1965 Today's Date: 07/25/2016    History of Present Illness Pt is a 51 y/o M s/p decompressive lumbar laminectomy, PLIF L4-L5   Clinical Impression   This 51 y/o M presents with the above. At baseline Pt is independent with ADLs, functional mobility, and works as a Physiological scientist. Pt currently requires supervision for functional mobility without AD, and ModA for LB ADLs secondary to adhering to back precautions. [name] after return home to assist with ADLs PRN. Educated Pt and spouse on back precautions, AE and compensatory techniques for completing ADLs while adhering to back precautions with [name], demonstrating understanding. Questions answered throughout with Pt and spouse reporting feeling comfortable with ADL completion after return home. No further OT needs identified at this time. Will sign off.     Follow Up Recommendations  No OT follow up;Supervision - Intermittent    Equipment Recommendations  None recommended by OT           Precautions / Restrictions Precautions Precautions: Back Precaution Booklet Issued: Yes (comment) Precaution Comments: Back precautions handout issued and reviewed  Spinal Brace: Lumbar corset;Applied in sitting position Restrictions Weight Bearing Restrictions: No      Mobility Bed Mobility Overal bed mobility: Independent             General bed mobility comments: [name]  Transfers Overall transfer level: Independent Equipment used: None             General transfer comment: supervision for safety during sit<>stand, room and hallway level functional mobility    Balance Overall balance assessment: No apparent balance deficits (not formally assessed)                                          ADL either performed or assessed with clinical judgement   ADL Overall ADL's : Needs assistance/impaired Eating/Feeding: Independent;Sitting   Grooming: Supervision/safety;Standing   Upper Body Bathing: Independent;Sitting   Lower Body Bathing: Minimal assistance;Sit to/from stand;Adhering to back precautions   Upper Body Dressing : Sitting;Supervision/safety;Independent   Lower Body Dressing: Moderate assistance;Sit to/from stand;Adhering to back precautions   Toilet Transfer: Nature conservation officer;Ambulation   Toileting- Clothing Manipulation and Hygiene: Min guard;Sit to/from Nurse, children's Details (indicate cue type and reason): educated Pt on safe transfer technique for walk-in shower  Functional mobility during ADLs: Supervision/safety General ADL Comments: Educated Pt and spouse on back precautions, AE and compensatory techniques for completing ADLs while adhering to back precautions      Vision Baseline Vision/History: Wears glasses Vision Assessment?: No apparent visual deficits                Pertinent Vitals/Pain Pain Assessment: Faces Faces Pain Scale: Hurts a little bit Pain Location: back (surgical pain) Pain Descriptors / Indicators: Sore Pain Intervention(s): Limited activity within patient's tolerance;Monitored during session     Hand Dominance     Extremity/Trunk Assessment Upper Extremity Assessment Upper Extremity Assessment: Overall WFL for tasks assessed   Lower Extremity Assessment Lower Extremity Assessment: Defer to PT evaluation   Cervical / Trunk Assessment Cervical / Trunk Assessment: Normal   Communication Communication Communication: No difficulties   Cognition Arousal/Alertness: Awake/alert Behavior During Therapy: [facility]  for tasks assessed/performed Overall Cognitive Status: Within Functional Limits for tasks assessed                                     General Comments                   Home Living Family/patient expects to be discharged to:: Private residence Living Arrangements: Spouse/significant other Available Help at Discharge: Family;Available 24 hours/day (initially ) Type of Home: House Home Access: Stairs to enter [employer] of Steps: 2   Home Layout: One level     Bathroom Shower/Tub: Occupational psychologist: Standard     Home Equipment: None          Prior Functioning/Environment Level of Independence: Independent        Comments: [name] Problem List: Decreased knowledge of use of DME or AE;Decreased knowledge of precautions;Decreased activity tolerance      OT Treatment/Interventions:      OT Goals(Current goals can be found in the care plan section) Acute Rehab OT Goals Patient Stated Goal: get back to work  OT Goal Formulation: With patient                                 AM-PAC PT "6 Clicks" Daily Activity     Outcome Measure Help from another person eating meals?: None Help from another person taking care of personal grooming?: None Help from another person toileting, which includes using toliet, bedpan, or urinal?: A Little Help from another person bathing (including washing, rinsing, drying)?: A Little Help from another person to put on and taking off regular upper body clothing?: None Help from another person to put on and taking off regular lower body clothing?: A Little 6 Click Score: 21   End of Session Equipment Utilized During Treatment: Back brace Nurse Communication: Mobility status  Activity Tolerance: Patient tolerated treatment well Patient left: in bed;with call bell/phone within reach;with family/visitor present  OT Visit Diagnosis: Muscle weakness (generalized) (M62.81)                Time: 3710-6269 OT Time Calculation (min): 20 min Charges:  OT General Charges $OT Visit: 1 Procedure OT Evaluation $OT Eval Low Complexity:  1 Procedure G-Codes:     Lou Cal, OT Pager [phone] 07/25/2016   Raymondo Band 07/25/2016, 8:42 AM

## 2016-08-04 ENCOUNTER — Other Ambulatory Visit: Payer: Self-pay | Admitting: Family Medicine

## 2016-08-05 ENCOUNTER — Other Ambulatory Visit: Payer: Self-pay | Admitting: Family Medicine

## 2016-08-20 DIAGNOSIS — M4316 Spondylolisthesis, lumbar region: Secondary | ICD-10-CM | POA: Diagnosis not present

## 2016-09-16 DIAGNOSIS — M4316 Spondylolisthesis, lumbar region: Secondary | ICD-10-CM | POA: Diagnosis not present

## 2016-10-07 ENCOUNTER — Ambulatory Visit (INDEPENDENT_AMBULATORY_CARE_PROVIDER_SITE_OTHER): Payer: BLUE CROSS/BLUE SHIELD | Admitting: Family Medicine

## 2016-10-07 ENCOUNTER — Encounter: Payer: Self-pay | Admitting: Family Medicine

## 2016-10-07 VITALS — BP 128/82 | HR 51 | Temp 98.0°F | Ht 71.0 in | Wt 178.2 lb

## 2016-10-07 DIAGNOSIS — Z0000 Encounter for general adult medical examination without abnormal findings: Secondary | ICD-10-CM | POA: Diagnosis not present

## 2016-10-07 DIAGNOSIS — E785 Hyperlipidemia, unspecified: Secondary | ICD-10-CM | POA: Diagnosis not present

## 2016-10-07 DIAGNOSIS — Z981 Arthrodesis status: Secondary | ICD-10-CM

## 2016-10-07 DIAGNOSIS — E291 Testicular hypofunction: Secondary | ICD-10-CM

## 2016-10-07 DIAGNOSIS — R739 Hyperglycemia, unspecified: Secondary | ICD-10-CM | POA: Diagnosis not present

## 2016-10-07 DIAGNOSIS — I10 Essential (primary) hypertension: Secondary | ICD-10-CM

## 2016-10-07 DIAGNOSIS — Z86010 Personal history of colonic polyps: Secondary | ICD-10-CM | POA: Diagnosis not present

## 2016-10-07 DIAGNOSIS — Z125 Encounter for screening for malignant neoplasm of prostate: Secondary | ICD-10-CM

## 2016-10-07 LAB — COMPREHENSIVE METABOLIC PANEL
ALBUMIN: 4.7 g/dL (ref 3.5–5.2)
ALT: 27 U/L (ref 0–53)
AST: 22 U/L (ref 0–37)
Alkaline Phosphatase: 53 U/L (ref 39–117)
BUN: 15 mg/dL (ref 6–23)
CHLORIDE: 100 meq/L (ref 96–112)
CO2: 33 mEq/L — ABNORMAL HIGH (ref 19–32)
Calcium: 9.9 mg/dL (ref 8.4–10.5)
Creatinine, Ser: 1.03 mg/dL (ref 0.40–1.50)
GFR: 80.98 mL/min (ref >60.00)
Glucose, Bld: 109 mg/dL — ABNORMAL HIGH (ref 70–99)
POTASSIUM: 4.4 meq/L (ref 3.5–5.1)
SODIUM: 139 meq/L (ref 135–145)
Total Bilirubin: 0.8 mg/dL (ref 0.2–1.2)
Total Protein: 7.2 g/dL (ref 6.0–8.3)

## 2016-10-07 LAB — LIPID PANEL
CHOL/HDL RATIO: 3
Cholesterol: 207 mg/dL — ABNORMAL HIGH (ref 0–200)
HDL: 70.1 mg/dL (ref >39.00)
LDL CALC: 111 mg/dL — AB (ref 0–99)
NONHDL: 136.48
TRIGLYCERIDES: 126 mg/dL (ref 0.0–149.0)
VLDL: 25.2 mg/dL (ref 0.0–40.0)

## 2016-10-07 LAB — PSA: PSA: 1.42 ng/mL (ref 0.10–4.00)

## 2016-10-07 LAB — CBC
HEMATOCRIT: 49.5 % (ref 39.0–52.0)
Hemoglobin: 16.9 g/dL (ref 13.0–17.0)
MCHC: 34.2 g/dL (ref 30.0–36.0)
MCV: 87.3 fl (ref 78.0–100.0)
Platelets: 239 10*3/uL (ref 150.0–400.0)
RBC: 5.67 Mil/uL (ref 4.22–5.81)
RDW: 13.2 % (ref 11.5–15.5)
WBC: 5.3 10*3/uL (ref 4.0–10.5)

## 2016-10-07 LAB — TESTOSTERONE: TESTOSTERONE: 250.78 ng/dL — AB (ref 300.00–890.00)

## 2016-10-07 MED ORDER — TESTOSTERONE 50 MG/5GM (1%) TD GEL: 5.0000 g | Freq: Every day | TRANSDERMAL | 5 refills | Status: DC

## 2016-10-07 NOTE — Assessment & Plan Note (Signed)
HLD- low 10 year risk- update lipids
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
## 2016-10-07 NOTE — Assessment & Plan Note (Signed)
HTN- controlled no meds- diet/exercise

## 2016-10-07 NOTE — Assessment & Plan Note (Signed)
Testicular hypofunction-  Compliant with androgel- decreased sexual performance with the change- hard to achieve erection on lyrica- that hs improved but lingering issues. Trial testim again

## 2016-10-07 NOTE — Assessment & Plan Note (Signed)
Hyperglycemia- on last years labs- had creamer in coffee last year. Also chewing gum he realized
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
## 2016-10-07 NOTE — Addendum Note (Signed)
Addended by: Tomi Likens on: 10/07/2016 10:48 AM   Modules accepted: Orders

## 2016-10-07 NOTE — Patient Instructions (Signed)
Trial testim- hopefully see improvement in symptoms. I do suspect testosterone may be slightly low today- we can repeat in a few months if you want or just repeat next year if symptoms improved  Please stop by lab before you go

## 2016-10-14 ENCOUNTER — Encounter: Payer: Self-pay | Admitting: Family Medicine

## 2016-10-17 ENCOUNTER — Telehealth: Payer: Self-pay | Admitting: Family Medicine

## 2016-10-17 NOTE — Telephone Encounter (Signed)
Prior auth please- who is this at [facility] or would that be you Roselyn Reef?

## 2016-10-17 NOTE — Telephone Encounter (Signed)
Pt is calling stating that he would like for Dr. Yong Channel to send something in to the insurance company [employer]) that has denied the Rx stating why he needs to be on the Brand name TESTIM 50 mg.

## 2016-10-21 NOTE — Telephone Encounter (Signed)
I will attempt a PA tomorrow on cover my meds

## 2016-10-23 NOTE — Telephone Encounter (Signed)
Prior Authorization was completed yesterday. I did look in the portal this morning but there is not a response yet. Patient was made aware yesterday that PA had been made

## 2016-10-24 ENCOUNTER — Encounter: Payer: Self-pay | Admitting: Internal Medicine

## 2016-10-24 DIAGNOSIS — D1801 Hemangioma of skin and subcutaneous tissue: Secondary | ICD-10-CM | POA: Diagnosis not present

## 2016-10-24 DIAGNOSIS — D225 Melanocytic nevi of trunk: Secondary | ICD-10-CM | POA: Diagnosis not present

## 2016-10-24 DIAGNOSIS — L821 Other seborrheic keratosis: Secondary | ICD-10-CM | POA: Diagnosis not present

## 2016-10-24 DIAGNOSIS — L570 Actinic keratosis: Secondary | ICD-10-CM | POA: Diagnosis not present

## 2016-10-24 DIAGNOSIS — D0359 Melanoma in situ of other part of trunk: Secondary | ICD-10-CM | POA: Diagnosis not present

## 2016-10-24 DIAGNOSIS — L812 Freckles: Secondary | ICD-10-CM | POA: Diagnosis not present

## 2016-10-24 NOTE — Telephone Encounter (Signed)
Patient called in regards to the message left to call the nurse back about his medication. Relay below message to patient about his prior auth. Patient verbalize understanding and will pick up his medication form the pharmacy . Thank you

## 2016-10-24 NOTE — Telephone Encounter (Signed)
[facility]. Please call and advise.

## 2016-10-24 NOTE — Telephone Encounter (Signed)
Called patient and asked for a return phone call to let him know we received Prior Authorization approval for his Testim so he should be able to get it filled at the pharmacy.

## 2016-11-13 DIAGNOSIS — D0359 Melanoma in situ of other part of trunk: Secondary | ICD-10-CM | POA: Diagnosis not present

## 2016-11-17 DIAGNOSIS — M9904 Segmental and somatic dysfunction of sacral region: Secondary | ICD-10-CM | POA: Diagnosis not present

## 2016-11-17 DIAGNOSIS — M62830 Muscle spasm of back: Secondary | ICD-10-CM | POA: Diagnosis not present

## 2016-11-17 DIAGNOSIS — M9901 Segmental and somatic dysfunction of cervical region: Secondary | ICD-10-CM | POA: Diagnosis not present

## 2016-11-17 DIAGNOSIS — M9903 Segmental and somatic dysfunction of lumbar region: Secondary | ICD-10-CM | POA: Diagnosis not present

## 2016-11-18 ENCOUNTER — Telehealth: Payer: Self-pay | Admitting: Family Medicine

## 2016-11-18 NOTE — Telephone Encounter (Signed)
[employer] approved TESTIM 50 mg/5gm (1%) TD gel from 10/22/16-01/19/2038. Reference number [facility]

## 2016-12-02 DIAGNOSIS — M4316 Spondylolisthesis, lumbar region: Secondary | ICD-10-CM | POA: Diagnosis not present

## 2016-12-10 DIAGNOSIS — M1611 Unilateral primary osteoarthritis, right hip: Secondary | ICD-10-CM | POA: Diagnosis not present

## 2016-12-19 ENCOUNTER — Other Ambulatory Visit: Payer: Self-pay

## 2016-12-19 ENCOUNTER — Ambulatory Visit (AMBULATORY_SURGERY_CENTER): Payer: Self-pay

## 2016-12-19 VITALS — Ht 70.0 in | Wt 183.6 lb

## 2016-12-19 DIAGNOSIS — Z86010 Personal history of colonic polyps: Secondary | ICD-10-CM

## 2016-12-19 MED ORDER — PLENVU 140 G PO SOLR: 1.0000 | ORAL | 0 refills | Status: DC

## 2016-12-19 NOTE — Progress Notes (Signed)
Denies allergies to eggs or soy products. Denies complication of anesthesia or sedation. Denies use of weight loss medication. Denies use of O2.   Emmi instructions declined.  

## 2017-01-02 ENCOUNTER — Other Ambulatory Visit: Payer: Self-pay

## 2017-01-02 ENCOUNTER — Ambulatory Visit (AMBULATORY_SURGERY_CENTER): Payer: BLUE CROSS/BLUE SHIELD | Admitting: Internal Medicine

## 2017-01-02 ENCOUNTER — Encounter: Payer: Self-pay | Admitting: Internal Medicine

## 2017-01-02 VITALS — BP 120/74 | HR 47 | Temp 97.1°F | Resp 12 | Ht 71.0 in | Wt 178.0 lb

## 2017-01-02 DIAGNOSIS — Z800 Family history of malignant neoplasm of digestive organs: Secondary | ICD-10-CM | POA: Diagnosis not present

## 2017-01-02 DIAGNOSIS — Z1211 Encounter for screening for malignant neoplasm of colon: Secondary | ICD-10-CM | POA: Diagnosis not present

## 2017-01-02 DIAGNOSIS — Z86010 Personal history of colonic polyps: Secondary | ICD-10-CM | POA: Diagnosis present

## 2017-01-02 MED ORDER — SODIUM CHLORIDE 0.9 % IV SOLN: 500.0000 mL | Freq: Once | INTRAVENOUS | Status: DC

## 2017-01-02 NOTE — Patient Instructions (Signed)
YOU HAD AN ENDOSCOPIC PROCEDURE TODAY AT [facility]:   Refer to the procedure report that was given to you for any specific questions about what was found during the examination.  If the procedure report does not answer your questions, please call your gastroenterologist to clarify.  If you requested that your care partner not be given the details of your procedure findings, then the procedure report has been included in a sealed envelope for you to review at your convenience later.  YOU SHOULD EXPECT: Some feelings of bloating in the abdomen. Passage of more gas than usual.  Walking can help get rid of the air that was put into your GI tract during the procedure and reduce the bloating. If you had a lower endoscopy (such as a colonoscopy or flexible sigmoidoscopy) you may notice spotting of blood in your stool or on the toilet paper. If you underwent a bowel prep for your procedure, you may not have a normal bowel movement for a few days.  Please Note:  You might notice some irritation and congestion in your nose or some drainage.  This is from the oxygen used during your procedure.  There is no need for concern and it should clear up in a day or so.  SYMPTOMS TO REPORT IMMEDIATELY:   Following lower endoscopy (colonoscopy or flexible sigmoidoscopy):  Excessive amounts of blood in the stool  Significant tenderness or worsening of abdominal pains  Swelling of the abdomen that is new, acute  Fever of 100F or higher    For urgent or emergent issues, a gastroenterologist can be reached at any hour by calling [phone].   DIET:  We do recommend a small meal at first, but then you may proceed to your regular diet.  Drink plenty of fluids but you should avoid alcoholic beverages for 24 hours.  ACTIVITY:  You should plan to take it easy for the rest of today and you should NOT DRIVE or use heavy machinery until tomorrow (because of the sedation medicines used during the test).     FOLLOW UP: Our staff will call the number listed on your records the next business day following your procedure to check on you and address any questions or concerns that you may have regarding the information given to you following your procedure. If we do not reach you, we will leave a message.  However, if you are feeling well and you are not experiencing any problems, there is no need to return our call.  We will assume that you have returned to your regular daily activities without incident.  If any biopsies were taken you will be contacted by phone or by letter within the next 1-3 weeks.  Please call us at [phone] if you have not heard about the biopsies in 3 weeks.    SIGNATURES/CONFIDENTIALITY: You and/or your care partner have signed paperwork which will be entered into your electronic medical record.  These signatures attest to the fact that that the information above on your After Visit Summary has been reviewed and is understood.  Full responsibility of the confidentiality of this discharge information lies with you and/or your care-partner.  Recall colonoscopy 5 years-2023

## 2017-01-02 NOTE — Progress Notes (Signed)
A and O x3. Report to RN. Tolerated MAC anesthesia well.

## 2017-01-02 NOTE — Progress Notes (Signed)
Pt's states no medical or surgical changes since previsit or office visit. 

## 2017-01-02 NOTE — Op Note (Signed)
[facility] Patient Name: Joshua Norris Procedure Date: 01/02/2017 1:32 PM MRN: [id number] Endoscopist: Docia Chuck. Henrene Pastor , MD Age: 51 Referring MD:  Date of Birth: 03/21/1965 Gender: Male Account #: [id number] Procedure:                Colonoscopy Indications:              High risk colon cancer surveillance: Personal                            history of sessile serrated colon polyp (less than                            10 mm in size) with no dysplasia. Father with colon                            cancer diagnosed in 12s. Index exam 2007 was                            negative. Last examination 2013 with small SSP Medicines:                Monitored Anesthesia Care Procedure:                Pre-Anesthesia Assessment:                           - Prior to the procedure, a History and Physical                            was performed, and patient medications and                            allergies were reviewed. The patient's tolerance of                            previous anesthesia was also reviewed. The risks                            and benefits of the procedure and the sedation                            options and risks were discussed with the patient.                            All questions were answered, and informed consent                            was obtained. Prior Anticoagulants: The patient has                            taken no previous anticoagulant or antiplatelet                            agents. After reviewing the risks and benefits, the  patient was deemed in satisfactory condition to                            undergo the procedure.                           After obtaining informed consent, the colonoscope                            was passed under direct vision. Throughout the                            procedure, the patient's blood pressure, pulse, and                            oxygen saturations were monitored  continuously. The                            Model CF-HQ190L [id number]) scope was introduced                            through the anus and advanced to the the cecum,                            identified by appendiceal orifice and ileocecal                            valve. The ileocecal valve, appendiceal orifice,                            and rectum were photographed. The quality of the                            bowel preparation was excellent. The colonoscopy                            was performed without difficulty. The patient                            tolerated the procedure well. The bowel preparation                            used was SUPREP. Scope In: 1:51:15 PM Scope Out: 2:05:44 PM Scope Withdrawal Time: 0 hours 11 minutes 16 seconds  Total Procedure Duration: 0 hours 14 minutes 29 seconds  Findings:                 The entire examined colon appeared normal on direct                            and retroflexion views. Complications:            No immediate complications. Estimated blood loss:                            None.  Estimated Blood Loss:     Estimated blood loss: none. Impression:               - The entire examined colon is normal on direct and                            retroflexion views.                           - No specimens collected. Recommendation:           - Repeat colonoscopy in 5 years for surveillance                            (family history).                           - Patient has a contact number available for                            emergencies. The signs and symptoms of potential                            delayed complications were discussed with the                            patient. Return to normal activities tomorrow.                            Written discharge instructions were provided to the                            patient.                           - Resume previous diet.                           - Continue present  medications. Docia Chuck. Henrene Pastor, MD 01/02/2017 2:09:53 PM This report has been signed electronically.

## 2017-01-05 ENCOUNTER — Telehealth: Payer: Self-pay | Admitting: *Deleted

## 2017-01-05 NOTE — Telephone Encounter (Signed)
  Follow up Call-  Call back number 01/02/2017  Post procedure Call Back phone  # [phone]  Permission to leave phone message Yes  Some recent data might be hidden     Patient questions:  Do you have a fever, pain , or abdominal swelling? No. Pain Score  0 *  Have you tolerated food without any problems? Yes.    Have you been able to return to your normal activities? Yes.    Do you have any questions about your discharge instructions: Diet   No. Medications  No. Follow up visit  No.  Do you have questions or concerns about your Care? No.  Actions: * If pain score is 4 or above: No action needed, pain <4.

## 2017-02-03 DIAGNOSIS — D225 Melanocytic nevi of trunk: Secondary | ICD-10-CM | POA: Diagnosis not present

## 2017-02-03 DIAGNOSIS — L821 Other seborrheic keratosis: Secondary | ICD-10-CM | POA: Diagnosis not present

## 2017-02-03 DIAGNOSIS — L812 Freckles: Secondary | ICD-10-CM | POA: Diagnosis not present

## 2017-04-08 ENCOUNTER — Other Ambulatory Visit: Payer: Self-pay | Admitting: Family Medicine

## 2017-04-08 MED ORDER — TESTOSTERONE 50 MG/5GM (1%) TD GEL: 5.0000 g | Freq: Every day | TRANSDERMAL | 5 refills | Status: DC

## 2017-04-08 NOTE — Telephone Encounter (Signed)
Copied from [place] [id number]. Topic: Quick Communication - Rx Refill/Question >> Apr 08, 2017 12:23 PM Ahmed Prima L wrote: Medication:  testosterone (TESTIM) 50 MG/5GM (1%) GEL  Has the patient contacted their pharmacy? Yes on monday   (Agent: If no, request that the patient contact the pharmacy for the refill.)  [id number] [place], [place] [address] [place] Preferred Pharmacy (with phone number or street name):    Agent: Please be advised that RX refills may take up to 3 business days. We ask that you follow-up with your pharmacy.

## 2017-04-08 NOTE — Telephone Encounter (Signed)
Rx refill request: Testim  LOV: 10/07/16   PCP: [place]: verified

## 2017-05-12 ENCOUNTER — Telehealth: Payer: Self-pay | Admitting: Family Medicine

## 2017-05-12 NOTE — Telephone Encounter (Unsigned)
Copied from [place] [id number]. Topic: Quick Communication - Rx Refill/Question >> May 12, 2017  1:34 PM Carolyn Stare wrote: Medication  testosterone (TESTIM) 50 MG/5GM (1%) GEL        [facility] not [facility] med and pt is asking for it to be sent [facility] been confirm that [facility] have it   Has the patient contacted their pharmacy yes    [facility] [place]   Agent: Please be advised that RX refills may take up to 3 business days. We ask that you follow-up with your pharmacy.

## 2017-05-13 ENCOUNTER — Other Ambulatory Visit: Payer: Self-pay

## 2017-05-13 MED ORDER — TESTOSTERONE 50 MG/5GM (1%) TD GEL: 5.0000 g | Freq: Every day | TRANSDERMAL | 5 refills | Status: DC

## 2017-05-13 NOTE — Telephone Encounter (Signed)
Request for change in pharmacy medication not avaliable at current pharmacy.

## 2017-05-13 NOTE — Telephone Encounter (Signed)
Patient is calling and states the medication needs to go to   [id number] [place] [place]  [place] [address]  Phone: [phone] Fax: [phone]

## 2017-05-14 ENCOUNTER — Other Ambulatory Visit: Payer: Self-pay

## 2017-05-14 MED ORDER — TESTOSTERONE 50 MG/5GM (1%) TD GEL: 5.0000 g | Freq: Every day | TRANSDERMAL | 5 refills | Status: DC

## 2017-05-14 NOTE — Telephone Encounter (Signed)
[facility] [place] [place].

## 2017-06-25 DIAGNOSIS — M62830 Muscle spasm of back: Secondary | ICD-10-CM | POA: Diagnosis not present

## 2017-06-25 DIAGNOSIS — M9901 Segmental and somatic dysfunction of cervical region: Secondary | ICD-10-CM | POA: Diagnosis not present

## 2017-06-25 DIAGNOSIS — M9904 Segmental and somatic dysfunction of sacral region: Secondary | ICD-10-CM | POA: Diagnosis not present

## 2017-06-25 DIAGNOSIS — M9903 Segmental and somatic dysfunction of lumbar region: Secondary | ICD-10-CM | POA: Diagnosis not present

## 2017-06-30 DIAGNOSIS — M9904 Segmental and somatic dysfunction of sacral region: Secondary | ICD-10-CM | POA: Diagnosis not present

## 2017-06-30 DIAGNOSIS — M9903 Segmental and somatic dysfunction of lumbar region: Secondary | ICD-10-CM | POA: Diagnosis not present

## 2017-06-30 DIAGNOSIS — M9901 Segmental and somatic dysfunction of cervical region: Secondary | ICD-10-CM | POA: Diagnosis not present

## 2017-06-30 DIAGNOSIS — M62830 Muscle spasm of back: Secondary | ICD-10-CM | POA: Diagnosis not present

## 2017-08-25 DIAGNOSIS — D1801 Hemangioma of skin and subcutaneous tissue: Secondary | ICD-10-CM | POA: Diagnosis not present

## 2017-08-25 DIAGNOSIS — D225 Melanocytic nevi of trunk: Secondary | ICD-10-CM | POA: Diagnosis not present

## 2017-08-25 DIAGNOSIS — L812 Freckles: Secondary | ICD-10-CM | POA: Diagnosis not present

## 2017-08-25 DIAGNOSIS — D2271 Melanocytic nevi of right lower limb, including hip: Secondary | ICD-10-CM | POA: Diagnosis not present

## 2017-09-04 DIAGNOSIS — Z0100 Encounter for examination of eyes and vision without abnormal findings: Secondary | ICD-10-CM | POA: Diagnosis not present

## 2017-09-08 DIAGNOSIS — R030 Elevated blood-pressure reading, without diagnosis of hypertension: Secondary | ICD-10-CM | POA: Diagnosis not present

## 2017-09-08 DIAGNOSIS — M542 Cervicalgia: Secondary | ICD-10-CM | POA: Diagnosis not present

## 2017-09-08 DIAGNOSIS — Z6825 Body mass index (BMI) 25.0-25.9, adult: Secondary | ICD-10-CM | POA: Diagnosis not present

## 2017-09-29 DIAGNOSIS — M542 Cervicalgia: Secondary | ICD-10-CM | POA: Diagnosis not present

## 2017-10-13 DIAGNOSIS — M5030 Other cervical disc degeneration, unspecified cervical region: Secondary | ICD-10-CM | POA: Diagnosis not present

## 2017-10-29 DIAGNOSIS — M5030 Other cervical disc degeneration, unspecified cervical region: Secondary | ICD-10-CM | POA: Diagnosis not present

## 2017-11-17 ENCOUNTER — Other Ambulatory Visit: Payer: Self-pay | Admitting: Family Medicine

## 2018-02-19 ENCOUNTER — Encounter: Payer: BLUE CROSS/BLUE SHIELD | Admitting: Family Medicine

## 2018-02-22 ENCOUNTER — Encounter: Payer: BLUE CROSS/BLUE SHIELD | Admitting: Family Medicine

## 2018-03-16 DIAGNOSIS — Z85820 Personal history of malignant melanoma of skin: Secondary | ICD-10-CM | POA: Diagnosis not present

## 2018-03-16 DIAGNOSIS — D2272 Melanocytic nevi of left lower limb, including hip: Secondary | ICD-10-CM | POA: Diagnosis not present

## 2018-03-16 DIAGNOSIS — L821 Other seborrheic keratosis: Secondary | ICD-10-CM | POA: Diagnosis not present

## 2018-03-16 DIAGNOSIS — D1801 Hemangioma of skin and subcutaneous tissue: Secondary | ICD-10-CM | POA: Diagnosis not present

## 2018-03-16 DIAGNOSIS — L812 Freckles: Secondary | ICD-10-CM | POA: Diagnosis not present

## 2018-03-18 ENCOUNTER — Encounter: Payer: Self-pay | Admitting: Family Medicine

## 2018-03-18 ENCOUNTER — Ambulatory Visit (INDEPENDENT_AMBULATORY_CARE_PROVIDER_SITE_OTHER): Payer: BLUE CROSS/BLUE SHIELD | Admitting: Family Medicine

## 2018-03-18 VITALS — BP 130/90 | HR 55 | Temp 98.0°F | Ht 71.0 in | Wt 184.4 lb

## 2018-03-18 DIAGNOSIS — I10 Essential (primary) hypertension: Secondary | ICD-10-CM

## 2018-03-18 DIAGNOSIS — E785 Hyperlipidemia, unspecified: Secondary | ICD-10-CM | POA: Diagnosis not present

## 2018-03-18 DIAGNOSIS — M542 Cervicalgia: Secondary | ICD-10-CM

## 2018-03-18 DIAGNOSIS — Z0000 Encounter for general adult medical examination without abnormal findings: Secondary | ICD-10-CM

## 2018-03-18 DIAGNOSIS — Z85820 Personal history of malignant melanoma of skin: Secondary | ICD-10-CM

## 2018-03-18 DIAGNOSIS — Z125 Encounter for screening for malignant neoplasm of prostate: Secondary | ICD-10-CM | POA: Diagnosis not present

## 2018-03-18 DIAGNOSIS — E291 Testicular hypofunction: Secondary | ICD-10-CM | POA: Diagnosis not present

## 2018-03-18 DIAGNOSIS — Z23 Encounter for immunization: Secondary | ICD-10-CM

## 2018-03-18 DIAGNOSIS — E663 Overweight: Secondary | ICD-10-CM

## 2018-03-18 DIAGNOSIS — Z6825 Body mass index (BMI) 25.0-25.9, adult: Secondary | ICD-10-CM
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
## 2018-03-18 NOTE — Patient Instructions (Addendum)
Health Maintenance Due  Topic Date Due  . TETANUS/TDAP Done today 07/26/2017  . INFLUENZA VACCINE Pt declined 08/20/2017   Schedule a lab visit at the check out desk within 2 weeks. Return for future fasting labs meaning nothing but water after midnight please. Ok to take your medications with water.   We will call you within two weeks about your referral to Dr. Tamala Julian of sports medicine for ongoing neck issues . If you do not hear within 3 weeks, give [place] a call.

## 2018-03-19 DIAGNOSIS — Z85820 Personal history of malignant melanoma of skin: Secondary | ICD-10-CM | POA: Insufficient documentation

## 2018-03-19 NOTE — Assessment & Plan Note (Signed)
Diet and exercise control. Mild poor control here but white coat element. bp 120/70s with home checks - continue without medication.

## 2018-03-30 ENCOUNTER — Other Ambulatory Visit (INDEPENDENT_AMBULATORY_CARE_PROVIDER_SITE_OTHER): Payer: BLUE CROSS/BLUE SHIELD

## 2018-03-30 DIAGNOSIS — E291 Testicular hypofunction: Secondary | ICD-10-CM | POA: Diagnosis not present

## 2018-03-30 DIAGNOSIS — Z0000 Encounter for general adult medical examination without abnormal findings: Secondary | ICD-10-CM | POA: Diagnosis not present

## 2018-03-30 DIAGNOSIS — E785 Hyperlipidemia, unspecified: Secondary | ICD-10-CM

## 2018-03-30 DIAGNOSIS — Z125 Encounter for screening for malignant neoplasm of prostate: Secondary | ICD-10-CM

## 2018-03-30 DIAGNOSIS — M542 Cervicalgia: Secondary | ICD-10-CM | POA: Diagnosis not present

## 2018-03-30 LAB — COMPREHENSIVE METABOLIC PANEL
ALT: 23 U/L (ref 0–53)
AST: 23 U/L (ref 0–37)
Albumin: 4.4 g/dL (ref 3.5–5.2)
Alkaline Phosphatase: 44 U/L (ref 39–117)
BUN: 22 mg/dL (ref 6–23)
CO2: 30 mEq/L (ref 19–32)
Calcium: 9.6 mg/dL (ref 8.4–10.5)
Chloride: 102 mEq/L (ref 96–112)
Creatinine, Ser: 1.07 mg/dL (ref 0.40–1.50)
GFR: 72.49 mL/min (ref >60.00)
Glucose, Bld: 111 mg/dL — ABNORMAL HIGH (ref 70–99)
Potassium: 5 mEq/L (ref 3.5–5.1)
Sodium: 138 mEq/L (ref 135–145)
Total Bilirubin: 0.6 mg/dL (ref 0.2–1.2)
Total Protein: 6.7 g/dL (ref 6.0–8.3)

## 2018-03-30 LAB — CBC
HCT: 46.1 % (ref 39.0–52.0)
Hemoglobin: 16 g/dL (ref 13.0–17.0)
MCHC: 34.7 g/dL (ref 30.0–36.0)
MCV: 86.9 fl (ref 78.0–100.0)
Platelets: 257 10*3/uL (ref 150.0–400.0)
RBC: 5.31 Mil/uL (ref 4.22–5.81)
RDW: 12.9 % (ref 11.5–15.5)
WBC: 5.8 10*3/uL (ref 4.0–10.5)

## 2018-03-30 LAB — PSA: PSA: 1.39 ng/mL (ref 0.10–4.00)

## 2018-03-30 LAB — LIPID PANEL
Cholesterol: 171 mg/dL (ref 0–200)
HDL: 48.2 mg/dL (ref >39.00)
LDL Cholesterol: 109 mg/dL — ABNORMAL HIGH (ref 0–99)
NONHDL: 123.12
Total CHOL/HDL Ratio: 4
Triglycerides: 72 mg/dL (ref 0.0–149.0)
VLDL: 14.4 mg/dL (ref 0.0–40.0)

## 2018-03-30 LAB — TESTOSTERONE: Testosterone: 612.33 ng/dL (ref 300.00–890.00)

## 2018-04-01 ENCOUNTER — Ambulatory Visit: Payer: BLUE CROSS/BLUE SHIELD | Admitting: Family Medicine

## 2018-04-01 LAB — ANA: ANA: NEGATIVE

## 2018-04-01 LAB — RHEUMATOID FACTOR

## 2018-04-15 DIAGNOSIS — M542 Cervicalgia: Secondary | ICD-10-CM | POA: Diagnosis not present

## 2018-05-23 ENCOUNTER — Other Ambulatory Visit: Payer: Self-pay | Admitting: Family Medicine

## 2018-05-24 NOTE — Telephone Encounter (Signed)
Last OV 03/18/2018 Last refill 11/17/2017 # 150g/5 Next OV not scheduled  Forwarding to Dr. Yong Channel

## 2018-06-10 DIAGNOSIS — Z6826 Body mass index (BMI) 26.0-26.9, adult: Secondary | ICD-10-CM | POA: Diagnosis not present

## 2018-06-10 DIAGNOSIS — M542 Cervicalgia: Secondary | ICD-10-CM | POA: Diagnosis not present

## 2018-06-10 DIAGNOSIS — R030 Elevated blood-pressure reading, without diagnosis of hypertension: Secondary | ICD-10-CM | POA: Diagnosis not present

## 2018-09-07 DIAGNOSIS — M47812 Spondylosis without myelopathy or radiculopathy, cervical region: Secondary | ICD-10-CM | POA: Diagnosis not present

## 2018-09-14 DIAGNOSIS — D225 Melanocytic nevi of trunk: Secondary | ICD-10-CM | POA: Diagnosis not present

## 2018-09-14 DIAGNOSIS — D1801 Hemangioma of skin and subcutaneous tissue: Secondary | ICD-10-CM | POA: Diagnosis not present

## 2018-09-14 DIAGNOSIS — L821 Other seborrheic keratosis: Secondary | ICD-10-CM | POA: Diagnosis not present

## 2018-09-14 DIAGNOSIS — L738 Other specified follicular disorders: Secondary | ICD-10-CM | POA: Diagnosis not present

## 2018-12-02 ENCOUNTER — Other Ambulatory Visit: Payer: Self-pay | Admitting: Family Medicine

## 2019-02-17 ENCOUNTER — Other Ambulatory Visit: Payer: Self-pay | Admitting: Family Medicine

## 2019-02-17 NOTE — Telephone Encounter (Signed)
Needs office visit.

## 2019-02-17 NOTE — Telephone Encounter (Signed)
Lats OV 03/18/18 Last refill 12/02/18 #150 g/ 1 Next OV not scheduled

## 2019-02-21 ENCOUNTER — Telehealth: Payer: Self-pay

## 2019-02-21 NOTE — Telephone Encounter (Signed)
Scheduled for tomorrow at 10:40 am-virtually

## 2019-02-21 NOTE — Telephone Encounter (Signed)
LAST APPOINTMENT DATE: @2 /27/2020  NEXT APPOINTMENT DATE:@Visit  date not found   LAST REFILL: last 12/02/2018 150 G 1 rf   QTY:  Do we need to call for CPE?

## 2019-02-21 NOTE — Telephone Encounter (Signed)
Please call for app  

## 2019-02-21 NOTE — Telephone Encounter (Signed)
MEDICATION:testosterone (ANDROGEL) 50 MG/5GM (1%) GEL  [id number] [place],  - [address] AT [place] Phone:  [phone]  Fax:  [phone]       Comments:   **Let patient know to contact pharmacy at the end of the day to make sure medication is ready. **  ** Please notify patient to allow 48-72 hours to process**  **Encourage patient to contact the pharmacy for refills or they can request refills through [facility]**

## 2019-02-21 NOTE — Telephone Encounter (Signed)
Needs visit as over a year. Can schedule physical if available but otherwise needs acute visit for refill then schedule physical further out

## 2019-02-22 ENCOUNTER — Ambulatory Visit (INDEPENDENT_AMBULATORY_CARE_PROVIDER_SITE_OTHER): Payer: BC Managed Care – PPO | Admitting: Family Medicine

## 2019-02-22 ENCOUNTER — Encounter: Payer: Self-pay | Admitting: Family Medicine

## 2019-02-22 VITALS — BP 122/76 | Temp 97.3°F | Ht 71.0 in | Wt 181.0 lb

## 2019-02-22 DIAGNOSIS — Z85820 Personal history of malignant melanoma of skin: Secondary | ICD-10-CM | POA: Diagnosis not present

## 2019-02-22 DIAGNOSIS — I10 Essential (primary) hypertension: Secondary | ICD-10-CM | POA: Diagnosis not present

## 2019-02-22 DIAGNOSIS — E785 Hyperlipidemia, unspecified: Secondary | ICD-10-CM | POA: Diagnosis not present

## 2019-02-22 DIAGNOSIS — E291 Testicular hypofunction: Secondary | ICD-10-CM

## 2019-02-22 DIAGNOSIS — Z86010 Personal history of colonic polyps: Secondary | ICD-10-CM

## 2019-02-22 MED ORDER — TESTOSTERONE 50 MG/5GM (1%) TD GEL: TRANSDERMAL | 5 refills | Status: DC

## 2019-02-22 NOTE — Patient Instructions (Addendum)
Health Maintenance Due  Topic Date Due  . INFLUENZA VACCINE -did not get 08/21/2018   Depression screen [facility] 2/9 03/18/2018 10/07/2016  Decreased Interest 0 0  Down, Depressed, Hopeless 0 0  PHQ - 2 Score 0 0

## 2019-02-22 NOTE — Progress Notes (Signed)
Phone [phone] Virtual visit via Video note   Subjective:  Chief complaint: Chief Complaint  Patient presents with  . virtual  . medication refill    testosterone gel   This visit type was conducted due to national recommendations for restrictions regarding the COVID-19 Pandemic (e.g. social distancing).  This format is felt to be most appropriate for this patient at this time balancing risks to patient and risks to population by having him in for in person visit.  No physical exam was performed (except for noted visual exam or audio findings with Telehealth visits).    Our team/I connected with Joshua Norris at 10:40 AM EST by a video enabled telemedicine application ([email or web address] or caregility through epic) and verified that I am speaking with the correct person using two identifiers.  Location patient: [place] Location provider: [facility], office Persons participating in the virtual visit:  patient  Our team/I discussed the limitations of evaluation and management by telemedicine and the availability of in person appointments. In light of current covid-19 pandemic, patient also understands that we are trying to protect them by minimizing in office contact if at all possible.  The patient expressed consent for telemedicine visit and agreed to proceed. Patient understands insurance will be billed.   Past Medical History-  Patient Active Problem List   Diagnosis Date Noted  . S/P lumbar spinal fusion 07/24/2016    Priority: High  . History of melanoma- [place] derm at least every 6 months 03/19/2018    Priority: Medium  . Hyperlipidemia 10/07/2016    Priority: Medium  . Hyperglycemia 02/15/2010    Priority: Medium  . 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 Essential hypertension 07/27/2007    Priority: Medium  . Family history of melanoma 03/16/2014    Priority: Low  . History of colonic polyps 03/16/2014    Priority: Low  . Osteoarthritis 03/16/2014   Priority: Low  . Allergic rhinitis 07/27/2007    Priority: Low   Medications- reviewed and updated Current Outpatient Medications  Medication Sig Dispense Refill  . B Complex Vitamins (B COMPLEX-B12 PO) Take 1 tablet by mouth daily.     . cholecalciferol (VITAMIN D) 1000 UNITS tablet Take 1,000 Units by mouth daily.    . Flaxseed, Linseed, (FLAX SEEDS PO) Take 1 tablet by mouth daily.     Marland Kitchen glucosamine-chondroitin 500-400 MG tablet Take 2 tablets by mouth daily.     Marland Kitchen GRAPE SEED EXTRACT PO Take 1 tablet by mouth daily.     Marland Kitchen ibuprofen (ADVIL,MOTRIN) 200 MG tablet Take 800 mg by mouth 2 (two) times daily.    . Misc Natural Products (BEE PROPOLIS PO) Take 1,000 mg by mouth 2 (two) times daily.    Marland Kitchen oxymetazoline (AFRIN) 0.05 % nasal spray Place 1 spray into both nostrils 2 (two) times daily as needed for congestion.    Marland Kitchen testosterone (ANDROGEL) 50 MG/5GM (1%) GEL PLACE 5 GRAMS ONTO THE SKIN DAILY 150 g 5  . Ginkgo Biloba (GINKOBA PO) Take 1 tablet by mouth daily.     Marland Kitchen MAGNESIUM OXIDE PO Take 1 tablet by mouth daily.     No current facility-administered medications for this visit.     Objective:  BP 122/76   Temp (!) 97.3 F (36.3 C)   Ht 5\' 11"  (1.803 m)   Wt 181 lb (82.1 kg)   BMI 25.24 kg/m  self reported vitals Gen: NAD, resting comfortably Lungs: nonlabored, normal respiratory rate  Skin:  appears dry, no obvious rash     Assessment and Plan   #Low testosterone/testicular hypofunction S: pt is needing a refill testosterone topical gel.  He states he has been feeling well with current dose. A/P: Hopefully good control-need to update testosterone level, PSA, CBC given long-term testosterone replacement.  Patient agrees to come by for labs.  We will try to see each other for a physical in 6 months and repeat labs at that time -We will delay labs until March 10 or later-he is moving on February 12 and needs a few weeks for things to settle down plus this lines up with last  year's labs  #Neck pain/right arm pain/high risk medication use S: Reviewed note from Dr. Ronnald Ramp of neurosurgery from 04/15/2018-plan at that time was to follow-up on x-rays.  Patient reports since that time he has had significant improvement in his symptoms-has had with Dr. Ramo's-Injections in 4 facet joints and epidural - neck doing better Pull ups and neck PT exercises- 200 pull ups per week.  He states overall he is 80% better.  Still using ibuprofen 800 mg daily A/P: Given long-term NSAID use we will need to update BMP.  Thrilled patient has seen such significant improvement-continue follow-up with neurosurgery  #hypertension S: Remains diet and exercise controlled BP Readings from Last 3 Encounters:  02/22/19 122/76  03/18/18 130/90  01/02/17 120/74  A/P: Excellent control last check-repeat at physical  #History of melanoma-follows with dermatology every 6 months at least   #hyperlipidemia S: Mild LDL elevation at 109 Lab Results  Component Value Date   CHOL 171 03/30/2018   HDL 48.20 03/30/2018   LDLCALC 109 (H) 03/30/2018   LDLDIRECT 135.7 12/27/2009   TRIG 72.0 03/30/2018   CHOLHDL 4 03/30/2018   A/P: 10-year ASCVD risk score of 3.6%.  Even if patient had elevation above 7.5% he would be strongly opposed to statin and instead would want to focus on continued efforts for healthy eating/regular exercise which he does a great job of   Recommended follow up: Will come by for labs in approximately a month-physical in 6 months or so  Lab/Order associations:   ICD-10-CM   1. Testicular hypofunction  E29.1 PSA    Testosterone  2. Hyperlipidemia, unspecified hyperlipidemia type  E78.5 CBC with Differential/Platelet    Comprehensive metabolic panel    Lipid panel  3. Essential hypertension  I10   4. History of melanoma- [place] derm at least every 6 months  Z85.820   5. History of colonic polyps  Z86.010     Meds ordered this encounter  Medications  . testosterone (ANDROGEL)  50 MG/5GM (1%) GEL    Sig: PLACE 5 GRAMS ONTO THE SKIN DAILY    Dispense:  150 g    Refill:  5    This request is for a new prescription for a controlled substance as required by Federal/State law.   Return precautions advised.  Garret Reddish, MD

## 2019-03-17 DIAGNOSIS — Z85820 Personal history of malignant melanoma of skin: Secondary | ICD-10-CM | POA: Diagnosis not present

## 2019-03-17 DIAGNOSIS — L812 Freckles: Secondary | ICD-10-CM | POA: Diagnosis not present

## 2019-03-17 DIAGNOSIS — L821 Other seborrheic keratosis: Secondary | ICD-10-CM | POA: Diagnosis not present

## 2019-03-17 DIAGNOSIS — D225 Melanocytic nevi of trunk: Secondary | ICD-10-CM | POA: Diagnosis not present

## 2019-03-17 DIAGNOSIS — L570 Actinic keratosis: Secondary | ICD-10-CM | POA: Diagnosis not present

## 2019-06-22 DIAGNOSIS — Z03818 Encounter for observation for suspected exposure to other biological agents ruled out: Secondary | ICD-10-CM | POA: Diagnosis not present

## 2019-07-28 DIAGNOSIS — M545 Low back pain: Secondary | ICD-10-CM | POA: Diagnosis not present

## 2019-07-28 DIAGNOSIS — R29898 Other symptoms and signs involving the musculoskeletal system: Secondary | ICD-10-CM | POA: Diagnosis not present

## 2019-08-30 DIAGNOSIS — G5601 Carpal tunnel syndrome, right upper limb: Secondary | ICD-10-CM | POA: Diagnosis not present

## 2019-09-13 DIAGNOSIS — G5601 Carpal tunnel syndrome, right upper limb: Secondary | ICD-10-CM | POA: Diagnosis not present

## 2019-09-14 ENCOUNTER — Other Ambulatory Visit: Payer: Self-pay | Admitting: Family Medicine

## 2019-09-14 DIAGNOSIS — D225 Melanocytic nevi of trunk: Secondary | ICD-10-CM | POA: Diagnosis not present

## 2019-09-14 DIAGNOSIS — L812 Freckles: Secondary | ICD-10-CM | POA: Diagnosis not present

## 2019-09-14 DIAGNOSIS — Z85820 Personal history of malignant melanoma of skin: Secondary | ICD-10-CM | POA: Diagnosis not present

## 2019-09-14 DIAGNOSIS — D1801 Hemangioma of skin and subcutaneous tissue: Secondary | ICD-10-CM | POA: Diagnosis not present

## 2019-09-14 DIAGNOSIS — L570 Actinic keratosis: Secondary | ICD-10-CM | POA: Diagnosis not present

## 2019-09-15 NOTE — Telephone Encounter (Signed)
Pt requesting refill, last visit 02/2019.

## 2019-09-15 NOTE — Telephone Encounter (Signed)
He needs to complete fasting labs from February before we refill this

## 2019-09-16 ENCOUNTER — Telehealth: Payer: Self-pay | Admitting: Family Medicine

## 2019-09-16 NOTE — Telephone Encounter (Signed)
.. °  LAST APPOINTMENT DATE: 09/14/2019   NEXT APPOINTMENT DATE:@Visit  date not found  MEDICATION:testosterone (ANDROGEL) 50 MG/5GM (1%) GEL  PHARMACY:[id number] - [place], [place] - [address] AT [address]  **Let patient know to contact pharmacy at the end of the day to make sure medication is ready. **  ** Please notify patient to allow 48-72 hours to process**  **Encourage patient to contact the pharmacy for refills or they can request refills through [facility]**  CLINICAL FILLS OUT ALL BELOW:   LAST REFILL:  QTY:  REFILL DATE:    OTHER COMMENTS:    Okay for refill?  Please advise

## 2019-09-16 NOTE — Telephone Encounter (Signed)
I have already responded to this twice and asked that he come off of the labs that were ordered in February

## 2019-09-16 NOTE — Telephone Encounter (Signed)
Please also call patient to advise of need to complete labs before refill

## 2019-09-16 NOTE — Telephone Encounter (Signed)
Pt requesting refills Rx Androgel

## 2019-09-19 ENCOUNTER — Other Ambulatory Visit: Payer: Self-pay | Admitting: Family Medicine

## 2019-09-20 NOTE — Telephone Encounter (Signed)
Needs to complete labs from February visit-please call him specifically to recommend this instead of writing this on the prescription denial

## 2019-09-20 NOTE — Telephone Encounter (Signed)
Can you call to make lab app

## 2019-09-20 NOTE — Telephone Encounter (Signed)
We have called and made lab app. Do you want to hold on refill until labs are done on 9/1

## 2019-09-20 NOTE — Telephone Encounter (Signed)
Pt scheduled  

## 2019-09-21 ENCOUNTER — Other Ambulatory Visit: Payer: Self-pay | Admitting: Family Medicine

## 2019-09-21 ENCOUNTER — Other Ambulatory Visit: Payer: BC Managed Care – PPO

## 2019-09-21 ENCOUNTER — Other Ambulatory Visit: Payer: Self-pay

## 2019-09-21 DIAGNOSIS — E785 Hyperlipidemia, unspecified: Secondary | ICD-10-CM | POA: Diagnosis not present

## 2019-09-21 DIAGNOSIS — E291 Testicular hypofunction: Secondary | ICD-10-CM

## 2019-09-21 NOTE — Addendum Note (Signed)
Addended by: Liliane Channel on: 09/21/2019 08:24 AM   Modules accepted: Orders

## 2019-09-22 LAB — COMPREHENSIVE METABOLIC PANEL
AG Ratio: 1.7 (calc) (ref 1.0–2.5)
ALT: 21 U/L (ref 9–46)
AST: 20 U/L (ref 10–35)
Albumin: 4.3 g/dL (ref 3.6–5.1)
Alkaline phosphatase (APISO): 45 U/L (ref 35–144)
BUN: 18 mg/dL (ref 7–25)
CO2: 31 mmol/L (ref 20–32)
Calcium: 9.3 mg/dL (ref 8.6–10.3)
Chloride: 102 mmol/L (ref 98–110)
Creat: 1.03 mg/dL (ref 0.70–1.33)
Globulin: 2.5 g/dL (calc) (ref 1.9–3.7)
Glucose, Bld: 112 mg/dL — ABNORMAL HIGH (ref 65–99)
Potassium: 4.4 mmol/L (ref 3.5–5.3)
Sodium: 138 mmol/L (ref 135–146)
Total Bilirubin: 0.9 mg/dL (ref 0.2–1.2)
Total Protein: 6.8 g/dL (ref 6.1–8.1)

## 2019-09-22 LAB — CBC WITH DIFFERENTIAL/PLATELET
Absolute Monocytes: 500 cells/uL (ref 200–950)
Basophils Absolute: 30 cells/uL (ref 0–200)
Basophils Relative: 0.6 %
Eosinophils Absolute: 230 cells/uL (ref 15–500)
Eosinophils Relative: 4.6 %
HCT: 45.6 % (ref 38.5–50.0)
Hemoglobin: 15.9 g/dL (ref 13.2–17.1)
Lymphs Abs: 1145 cells/uL (ref 850–3900)
MCH: 30.9 pg (ref 27.0–33.0)
MCHC: 34.9 g/dL (ref 32.0–36.0)
MCV: 88.7 fL (ref 80.0–100.0)
MPV: 9.9 fL (ref 7.5–12.5)
Monocytes Relative: 10 %
Neutro Abs: 3095 cells/uL (ref 1500–7800)
Neutrophils Relative %: 61.9 %
Platelets: 241 10*3/uL (ref 140–400)
RBC: 5.14 10*6/uL (ref 4.20–5.80)
RDW: 12.6 % (ref 11.0–15.0)
Total Lymphocyte: 22.9 %
WBC: 5 10*3/uL (ref 3.8–10.8)

## 2019-09-22 LAB — PSA: PSA: 1.1 ng/mL (ref <=4.0)

## 2019-09-22 LAB — LIPID PANEL
Cholesterol: 186 mg/dL (ref <200)
HDL: 48 mg/dL (ref >=40)
LDL Cholesterol (Calc): 116 mg/dL (calc) — ABNORMAL HIGH
Non-HDL Cholesterol (Calc): 138 mg/dL (calc) — ABNORMAL HIGH (ref <130)
Total CHOL/HDL Ratio: 3.9 (calc) (ref <5.0)
Triglycerides: 112 mg/dL (ref <150)

## 2019-09-22 LAB — TESTOSTERONE: Testosterone: 956 ng/dL — ABNORMAL HIGH (ref 250–827)

## 2019-09-24 ENCOUNTER — Other Ambulatory Visit: Payer: Self-pay | Admitting: Family Medicine

## 2019-09-24 MED ORDER — TESTOSTERONE 50 MG/5GM (1%) TD GEL
2.5000 g | Freq: Every day | TRANSDERMAL | 1 refills | Status: DC
Start: 2019-09-24 — End: 2019-10-25

## 2019-10-25 ENCOUNTER — Other Ambulatory Visit: Payer: Self-pay | Admitting: Family Medicine

## 2019-10-25 NOTE — Telephone Encounter (Signed)
Scheduled pt for 02/23/2020. This was the earliest CPE appointment.

## 2019-10-25 NOTE — Telephone Encounter (Signed)
Please schedule pt for CPE next year and Dr. Yong Channel will refill this.

## 2019-10-25 NOTE — Telephone Encounter (Signed)
LVM for patient to call back and schedule CPE for February.

## 2019-10-25 NOTE — Telephone Encounter (Signed)
Pt has been scheduled.  °

## 2019-10-25 NOTE — Telephone Encounter (Signed)
Get him scheduled for next yearly physical please and then I will fill this

## 2020-02-22 NOTE — Progress Notes (Signed)
Phone: [phone]    Subjective:  Patient presents today for their annual physical. Chief complaint-noted.   See problem oriented charting- ROS- full  review of systems was completed and negative  except for: tremor, neck pain, delayed ejaculation  The following were reviewed and entered/updated in epic: Past Medical History:  Diagnosis Date  . Allergy   . Arthritis    Joints  . Asthma    infant  . Cancer ([place])    pre cancer- skin  . Complication of anesthesia    n/v  . Family history of adverse reaction to anesthesia    Mother- N/V  . Lipoma 10/27/2008   Excised from behind R arm    . Seasonal allergies    Patient Active Problem List   Diagnosis Date Noted  . S/P lumbar spinal fusion 07/24/2016    Priority: High  . History of melanoma- [place] derm at least every 6 months 03/19/2018    Priority: Medium  . Hyperlipidemia 10/07/2016    Priority: Medium  . Hyperglycemia 02/15/2010    Priority: Medium  . 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 Essential hypertension 07/27/2007    Priority: Medium  . Family history of melanoma 03/16/2014    Priority: Low  . History of colonic polyps 03/16/2014    Priority: Low  . Osteoarthritis 03/16/2014    Priority: Low  . Allergic rhinitis 07/27/2007    Priority: Low   Past Surgical History:  Procedure Laterality Date  . COLONOSCOPY    . COLONOSCOPY    . COLONOSCOPY W/ POLYPECTOMY     age 63  . HIP SURGERY Right Apr 26, 2005   right; torn labrum  . LUMBAR FUSION     L4-L5 late 04-26-16  . ROTATOR CUFF REPAIR Right    avulsed infraspinatus and supraspinatus    Family History  Problem Relation Age of Onset  . Hypertension Mother   . Sleep apnea Mother        overweight  . Emphysema Mother        smoker  . Psoriasis Mother        arthritis  . Colon cancer Father 68       end stage. died age 6 in April 27, 2014.   Marland Kitchen Pancreatic cancer Father 60       asymptomatic at presentation  . Prostate cancer Father 35  .  Melanoma Father 53  . Prostate cancer Brother 27       now metastatic  . Crohn's disease Brother   . Tuberculosis Brother        inactive  . Rheum arthritis Maternal Grandmother   . Stomach cancer Neg Hx   . Esophageal cancer Neg Hx     Medications- reviewed and updated Current Outpatient Medications  Medication Sig Dispense Refill  . B Complex Vitamins (B COMPLEX-B12 PO) Take 1 tablet by mouth daily.     . cholecalciferol (VITAMIN D) 1000 UNITS tablet Take 1,000 Units by mouth daily.    . Flaxseed, Linseed, (FLAX SEEDS PO) Take 1 tablet by mouth daily.     Marland Kitchen glucosamine-chondroitin 500-400 MG tablet Take 2 tablets by mouth daily.     Marland Kitchen GRAPE SEED EXTRACT PO Take 1 tablet by mouth daily.     Marland Kitchen ibuprofen (ADVIL,MOTRIN) 200 MG tablet Take 800 mg by mouth 2 (two) times daily.    . Misc Natural Products (BEE PROPOLIS PO) Take 1,000 mg by mouth 2 (two) times daily.    Marland Kitchen oxymetazoline (AFRIN) 0.05 %  nasal spray Place 1 spray into both nostrils 2 (two) times daily as needed for congestion.    Marland Kitchen testosterone (ANDROGEL) 50 MG/5GM (1%) GEL PLACE 5 GRAMS ONTO THE SKIN DAILY 150 g 5   No current facility-administered medications for this visit.    Allergies-reviewed and updated Allergies  Allergen Reactions  . Percocet [Oxycodone-Acetaminophen] Nausea And Vomiting    Unsure if anesthesia or Percocet  . Tramadol Nausea And Vomiting  . Dilaudid [Hydromorphone Hcl] Nausea And Vomiting  . Latex Other (See Comments)    Canker sores from dental procedures from gloves  . Other Nausea And Vomiting    Severe nausea /vomiting from anesthesia or dilaudid.  Not sure which one caused it.     Social History   Social History Narrative   Married. 1 son from 58st marriage 8 years old in 2016.       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 Oropharynx normal Neck: no thyromegaly CV: RRR no murmurs rubs or gallops Lungs: CTAB no crackles, wheeze, rhonchi Abdomen: soft/nontender/nondistended/normal bowel sounds. No rebound or guarding.  Ext: no edema Skin: warm, dry Neuro: grossly normal, moves all extremities, PERRLA    Assessment and Plan:  55 y.o. male presenting for annual physical.  Health Maintenance counseling: 1. Anticipatory guidance: Patient counseled regarding regular dental exams -q6 months- smokeless tobacco for many years, eye exams - yearly for contacts,  avoiding smoking and second hand smoke , limiting alcohol to 2 beverages per day- total 3 drinks per week.   2. Risk factor reduction:  Advised patient of need for regular exercise and diet rich and fruits and vegetables to reduce risk of heart attack and stroke. 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 Diet-eats a healthy diet 85-90%- a lot of fruit Wt Readings from Last 3 Encounters:  02/23/20 192 lb (87.1 kg)  02/22/19 181 lb (82.1 kg)  03/18/18 184 lb 6.4 oz (83.6 kg)  3. Immunizations/screenings/ancillary studies-declines flu shot. Discussed hepatitis C screening- with labs  Immunization History  Administered Date(s) Administered  . Influenza-Unspecified 10/04/2013, 11/28/2015  . PFIZER(Purple Top)SARS-COV-2 Vaccination 04/14/2019, 05/05/2019, 12/27/2019  . Td 07/27/2007  . Tdap 03/18/2018  4. Prostate cancer screening- family history of prostate cancer plus on testosterone medication- continue to trend Lab Results  Component Value Date   PSA 1.1 09/21/2019   PSA 1.39 03/30/2018   PSA 1.42 10/07/2016   5. Colon cancer screening - 01/02/2017 with 5-year follow-up. With dads history he may want a CEA at some point  potentially 6. Skin cancer screening/prevention-history of melanoma-follows with dermatology every 6 months. advised regular sunscreen use. Denies worrisome, changing, or new skin lesions.  7. STD screening- patient opts out as only active with wife 74. Never smoker-   Status of chronic or acute concerns  #essential tremor- slightly worse intermittently. Worse with caffeine. 1/3 of a beer 3-4x a week- not sure if that has helped at all. Not bad enough to want to take  medicine for it  #Low testosterone/testicular hypofunction S: Patient compliant with testosterone gel. He has been feeling well on current dose A/P: Hopefully remains controlled. Update PSA, CBC given long-term testosterone replacement.  Delayed ejaculation- discussed possible cialis- wants to hold off for now   #Neck pain/right arm pain/high risk medication use-patient with history of injections with Dr. Herma Mering which were very helpful- has not needed lately. Doing accupuncture as well and that's helpful.  Last visit was using ibuprofen 800 mg daily-today he reports taking most days at least once a day.  -discussed considering 6 month checks even if not coming into the office  -10/2017 was changed to androgel - looks like was sent through as this instead of testim- we are going to look at testosterone and PSA- possible change back- testim was cheaper but testosterone was higher over 900  #hyperlipidemia S: Medication:None Lab Results  Component Value Date   CHOL 186 09/21/2019   HDL 48 09/21/2019   LDLCALC 116 (H) 09/21/2019   LDLDIRECT 135.7 12/27/2009   TRIG 112 09/21/2019   CHOLHDL 3.9 09/21/2019   A/P: doing a great job with healthy eating/regular exercise- 5% 10 year ascvd risk- no medication recommended under 7.5%  # Hyperglycemia/insulin resistance/prediabetes- usually chewing gum- with cbgs slightly high- opts out this year Lab Results  Component Value Date   HGBA1C 5.5 02/15/2010   #hypertension S:  medication: Controlled without medication BP Readings from Last 3 Encounters:  02/23/20 130/84  02/22/19 122/76  03/18/18 130/90  A/P: Stable. Continue current medications.   Recommended follow up: Return in about 1 year (around 02/22/2021) for physical or sooner if needed. 6 month kidney check would be reasonable- you can message me.  Lab/Order associations: NOT fasting   ICD-10-CM   1. Essential hypertension  I10 CBC with Differential/Platelet    Comprehensive metabolic panel    CANCELED: Lipid panel  2. Preventative health care  Z00.00 Hepatitis C antibody    CBC with Differential/Platelet    Comprehensive metabolic panel    Testosterone    Hemoglobin A1c    PSA    CANCELED: Lipid panel  3. Hyperglycemia  R73.9 Hemoglobin A1c  4. Hyperlipidemia, unspecified hyperlipidemia type  E78.5 CANCELED: Lipid panel  5. Encounter for hepatitis C screening test for low risk patient  Z11.59 Hepatitis C antibody  6. Testicular hypofunction  E29.1 Testosterone  7. High risk medication use  Z79.899 PSA  8. Screening for prostate cancer  Z12.5 PSA    No orders of the defined types were placed in this encounter.   Return precautions advised.   Garret Reddish, MD

## 2020-02-22 NOTE — Patient Instructions (Addendum)
Thanks for doing labs If you have mychart- we will send your results within 3 business days of [place] receiving them.  If you do not have mychart- we will call you about results within 5 business days of [place] receiving them.  *please also note that you will see labs on mychart as soon as they post. I will later go in and write notes on them- will say "notes from Dr. Yong Channel"  No changes today unless labs lead [place] to make changes

## 2020-02-23 ENCOUNTER — Other Ambulatory Visit: Payer: Self-pay

## 2020-02-23 ENCOUNTER — Ambulatory Visit (INDEPENDENT_AMBULATORY_CARE_PROVIDER_SITE_OTHER): Payer: BC Managed Care – PPO | Admitting: Family Medicine

## 2020-02-23 ENCOUNTER — Encounter: Payer: Self-pay | Admitting: Family Medicine

## 2020-02-23 VITALS — BP 130/84 | HR 67 | Temp 98.1°F | Resp 16 | Ht 71.0 in | Wt 192.0 lb

## 2020-02-23 DIAGNOSIS — I10 Essential (primary) hypertension: Secondary | ICD-10-CM | POA: Diagnosis not present

## 2020-02-23 DIAGNOSIS — R739 Hyperglycemia, unspecified: Secondary | ICD-10-CM | POA: Diagnosis not present

## 2020-02-23 DIAGNOSIS — E785 Hyperlipidemia, unspecified: Secondary | ICD-10-CM | POA: Diagnosis not present

## 2020-02-23 DIAGNOSIS — Z0000 Encounter for general adult medical examination without abnormal findings: Secondary | ICD-10-CM | POA: Diagnosis not present

## 2020-02-23 DIAGNOSIS — Z125 Encounter for screening for malignant neoplasm of prostate: Secondary | ICD-10-CM | POA: Diagnosis not present

## 2020-02-23 DIAGNOSIS — Z79899 Other long term (current) drug therapy: Secondary | ICD-10-CM

## 2020-02-23 DIAGNOSIS — E291 Testicular hypofunction: Secondary | ICD-10-CM | POA: Diagnosis not present

## 2020-02-23 DIAGNOSIS — Z1159 Encounter for screening for other viral diseases: Secondary | ICD-10-CM | POA: Diagnosis not present

## 2020-02-24 ENCOUNTER — Encounter: Payer: Self-pay | Admitting: Family Medicine

## 2020-02-24 LAB — CBC WITH DIFFERENTIAL/PLATELET
Basophils Absolute: 0 10*3/uL (ref 0.0–0.1)
Basophils Relative: 0.5 % (ref 0.0–3.0)
Eosinophils Absolute: 0.3 10*3/uL (ref 0.0–0.7)
Eosinophils Relative: 3.7 % (ref 0.0–5.0)
HCT: 45.2 % (ref 39.0–52.0)
Hemoglobin: 15.7 g/dL (ref 13.0–17.0)
Lymphocytes Relative: 20.7 % (ref 12.0–46.0)
Lymphs Abs: 1.4 10*3/uL (ref 0.7–4.0)
MCHC: 34.8 g/dL (ref 30.0–36.0)
MCV: 84.8 fl (ref 78.0–100.0)
Monocytes Absolute: 0.5 10*3/uL (ref 0.1–1.0)
Monocytes Relative: 7.9 % (ref 3.0–12.0)
Neutro Abs: 4.7 10*3/uL (ref 1.4–7.7)
Neutrophils Relative %: 67.2 % (ref 43.0–77.0)
Platelets: 255 10*3/uL (ref 150.0–400.0)
RBC: 5.33 Mil/uL (ref 4.22–5.81)
RDW: 13 % (ref 11.5–15.5)
WBC: 6.9 10*3/uL (ref 4.0–10.5)

## 2020-02-24 LAB — COMPREHENSIVE METABOLIC PANEL
ALT: 34 U/L (ref 0–53)
AST: 25 U/L (ref 0–37)
Albumin: 4.3 g/dL (ref 3.5–5.2)
Alkaline Phosphatase: 48 U/L (ref 39–117)
BUN: 22 mg/dL (ref 6–23)
CO2: 27 mEq/L (ref 19–32)
Calcium: 9.6 mg/dL (ref 8.4–10.5)
Chloride: 102 mEq/L (ref 96–112)
Creatinine, Ser: 1.02 mg/dL (ref 0.40–1.50)
GFR: 83.5 mL/min (ref >60.00)
Glucose, Bld: 95 mg/dL (ref 70–99)
Potassium: 4.2 mEq/L (ref 3.5–5.1)
Sodium: 136 mEq/L (ref 135–145)
Total Bilirubin: 0.4 mg/dL (ref 0.2–1.2)
Total Protein: 7.1 g/dL (ref 6.0–8.3)

## 2020-02-24 LAB — HEPATITIS C ANTIBODY
Hepatitis C Ab: NONREACTIVE
SIGNAL TO CUT-OFF: 0.01 (ref <1.00)

## 2020-02-24 LAB — PSA: PSA: 1.31 ng/mL (ref 0.10–4.00)

## 2020-02-24 LAB — HEMOGLOBIN A1C: Hgb A1c MFr Bld: 5.6 % (ref 4.6–6.5)

## 2020-02-24 LAB — TESTOSTERONE: Testosterone: 509.91 ng/dL (ref 300.00–890.00)

## 2020-02-25 MED ORDER — TADALAFIL 20 MG PO TABS: 20.0000 mg | ORAL_TABLET | ORAL | 11 refills | Status: DC | PRN

## 2020-03-01 ENCOUNTER — Other Ambulatory Visit: Payer: Self-pay | Admitting: Family Medicine

## 2020-03-07 ENCOUNTER — Other Ambulatory Visit: Payer: Self-pay | Admitting: Family Medicine

## 2020-03-07 MED ORDER — TADALAFIL 20 MG PO TABS: 20.0000 mg | ORAL_TABLET | ORAL | 11 refills | Status: DC | PRN

## 2020-03-12 MED ORDER — TADALAFIL 20 MG PO TABS: 20.0000 mg | ORAL_TABLET | ORAL | 11 refills | Status: DC

## 2020-03-12 NOTE — Addendum Note (Signed)
Addended by: Marin Olp on: 03/12/2020 07:49 PM   Modules accepted: Orders

## 2020-03-19 DIAGNOSIS — D1801 Hemangioma of skin and subcutaneous tissue: Secondary | ICD-10-CM | POA: Diagnosis not present

## 2020-03-19 DIAGNOSIS — L814 Other melanin hyperpigmentation: Secondary | ICD-10-CM | POA: Diagnosis not present

## 2020-03-19 DIAGNOSIS — L570 Actinic keratosis: Secondary | ICD-10-CM | POA: Diagnosis not present

## 2020-03-19 DIAGNOSIS — Z85820 Personal history of malignant melanoma of skin: Secondary | ICD-10-CM | POA: Diagnosis not present

## 2020-03-19 DIAGNOSIS — L821 Other seborrheic keratosis: Secondary | ICD-10-CM | POA: Diagnosis not present

## 2020-04-06 DIAGNOSIS — Z0100 Encounter for examination of eyes and vision without abnormal findings: Secondary | ICD-10-CM | POA: Diagnosis not present

## 2020-05-02 ENCOUNTER — Encounter: Payer: Self-pay | Admitting: Family Medicine

## 2020-05-08 DIAGNOSIS — M5412 Radiculopathy, cervical region: Secondary | ICD-10-CM | POA: Diagnosis not present

## 2020-07-14 ENCOUNTER — Other Ambulatory Visit: Payer: Self-pay | Admitting: Family Medicine

## 2020-09-08 ENCOUNTER — Encounter: Payer: Self-pay | Admitting: Family Medicine

## 2020-09-11 ENCOUNTER — Other Ambulatory Visit: Payer: Self-pay

## 2020-09-11 ENCOUNTER — Other Ambulatory Visit (HOSPITAL_COMMUNITY): Payer: Self-pay | Admitting: Neurological Surgery

## 2020-09-11 ENCOUNTER — Ambulatory Visit (HOSPITAL_BASED_OUTPATIENT_CLINIC_OR_DEPARTMENT_OTHER)
Admission: RE | Admit: 2020-09-11 | Discharge: 2020-09-11 | Disposition: A | Discharge status: Home / Self Care | Payer: BC Managed Care – PPO | Source: Ambulatory Visit | Attending: Neurological Surgery | Admitting: Neurological Surgery

## 2020-09-11 ENCOUNTER — Encounter (HOSPITAL_BASED_OUTPATIENT_CLINIC_OR_DEPARTMENT_OTHER): Payer: Self-pay | Admitting: Radiology

## 2020-09-11 DIAGNOSIS — R221 Localized swelling, mass and lump, neck: Secondary | ICD-10-CM | POA: Insufficient documentation

## 2020-09-11 DIAGNOSIS — E041 Nontoxic single thyroid nodule: Secondary | ICD-10-CM | POA: Diagnosis not present

## 2020-09-11 MED ORDER — IOHEXOL 350 MG/ML SOLN
60.0000 mL | Freq: Once | INTRAVENOUS | Status: AC | PRN
  Administered 2020-09-11: 60 mL via INTRAVENOUS

## 2020-09-13 DIAGNOSIS — J343 Hypertrophy of nasal turbinates: Secondary | ICD-10-CM | POA: Diagnosis not present

## 2020-09-13 DIAGNOSIS — E0789 Other specified disorders of thyroid: Secondary | ICD-10-CM | POA: Diagnosis not present

## 2020-09-18 DIAGNOSIS — L720 Epidermal cyst: Secondary | ICD-10-CM | POA: Diagnosis not present

## 2020-09-18 DIAGNOSIS — D2271 Melanocytic nevi of right lower limb, including hip: Secondary | ICD-10-CM | POA: Diagnosis not present

## 2020-09-18 DIAGNOSIS — L570 Actinic keratosis: Secondary | ICD-10-CM | POA: Diagnosis not present

## 2020-09-18 DIAGNOSIS — D1801 Hemangioma of skin and subcutaneous tissue: Secondary | ICD-10-CM | POA: Diagnosis not present

## 2020-09-18 DIAGNOSIS — L812 Freckles: Secondary | ICD-10-CM | POA: Diagnosis not present

## 2020-09-21 ENCOUNTER — Ambulatory Visit: Payer: BC Managed Care – PPO | Admitting: Family Medicine

## 2020-09-28 ENCOUNTER — Other Ambulatory Visit: Payer: Self-pay | Admitting: Otolaryngology

## 2020-10-05 ENCOUNTER — Encounter (HOSPITAL_BASED_OUTPATIENT_CLINIC_OR_DEPARTMENT_OTHER): Payer: Self-pay | Admitting: Otolaryngology

## 2020-10-14 NOTE — Anesthesia Preprocedure Evaluation (Addendum)
Anesthesia Evaluation  Patient identified by MRN, date of birth, ID band Patient awake    Reviewed: Allergy & Precautions, NPO status , Patient's Chart, lab work & pertinent test results  History of Anesthesia Complications (+) PONV and history of anesthetic complications  Airway Mallampati: II  TM Distance: >3 FB Neck ROM: Full    Dental no notable dental hx.    Pulmonary asthma ,    Pulmonary exam normal breath sounds clear to auscultation       Cardiovascular Exercise Tolerance: Good hypertension, Pt. on medications Normal cardiovascular exam Rhythm:Regular Rate:Normal     Neuro/Psych negative neurological ROS  negative psych ROS   GI/Hepatic negative GI ROS, Neg liver ROS,   Endo/Other  negative endocrine ROS  Renal/GU negative Renal ROS  negative genitourinary   Musculoskeletal  (+) Arthritis  (s/p lumbar fusion), Osteoarthritis,    Abdominal   Peds  Hematology negative hematology ROS (+)   Anesthesia Other Findings   Reproductive/Obstetrics                            Anesthesia Physical Anesthesia Plan  ASA: 2  Anesthesia Plan: General   Post-op Pain Management:    Induction: Intravenous  PONV Risk Score and Plan: Scopolamine patch - Pre-op, Treatment may vary due to age or medical condition, Midazolam, Ondansetron and Dexamethasone  Airway Management Planned: Oral ETT  Additional Equipment: None  Intra-op Plan:   Post-operative Plan: Extubation in OR  Informed Consent: I have reviewed the patients History and Physical, chart, labs and discussed the procedure including the risks, benefits and alternatives for the proposed anesthesia with the patient or authorized representative who has indicated his/her understanding and acceptance.     Dental advisory given  Plan Discussed with: Anesthesiologist and CRNA  Anesthesia Plan Comments:        Anesthesia Quick  Evaluation

## 2020-10-15 ENCOUNTER — Ambulatory Visit (HOSPITAL_BASED_OUTPATIENT_CLINIC_OR_DEPARTMENT_OTHER): Payer: BC Managed Care – PPO | Admitting: Certified Registered"

## 2020-10-15 ENCOUNTER — Encounter (HOSPITAL_BASED_OUTPATIENT_CLINIC_OR_DEPARTMENT_OTHER): Payer: Self-pay | Admitting: Otolaryngology

## 2020-10-15 ENCOUNTER — Ambulatory Visit (HOSPITAL_COMMUNITY)
Admission: RE | Admit: 2020-10-15 | Discharge: 2020-10-15 | Disposition: A | Discharge status: Home / Self Care | Payer: BC Managed Care – PPO | Source: Ambulatory Visit | Attending: Otolaryngology | Admitting: Otolaryngology

## 2020-10-15 ENCOUNTER — Other Ambulatory Visit: Payer: Self-pay

## 2020-10-15 ENCOUNTER — Encounter (HOSPITAL_BASED_OUTPATIENT_CLINIC_OR_DEPARTMENT_OTHER)
Admission: RE | Disposition: A | Discharge status: Home / Self Care | Payer: Self-pay | Source: Ambulatory Visit | Attending: Otolaryngology

## 2020-10-15 DIAGNOSIS — Z91040 Latex allergy status: Secondary | ICD-10-CM | POA: Insufficient documentation

## 2020-10-15 DIAGNOSIS — Z791 Long term (current) use of non-steroidal anti-inflammatories (NSAID): Secondary | ICD-10-CM | POA: Diagnosis not present

## 2020-10-15 DIAGNOSIS — Z885 Allergy status to narcotic agent status: Secondary | ICD-10-CM | POA: Insufficient documentation

## 2020-10-15 DIAGNOSIS — Z79899 Other long term (current) drug therapy: Secondary | ICD-10-CM | POA: Diagnosis not present

## 2020-10-15 DIAGNOSIS — Z884 Allergy status to anesthetic agent status: Secondary | ICD-10-CM | POA: Diagnosis not present

## 2020-10-15 DIAGNOSIS — E041 Nontoxic single thyroid nodule: Secondary | ICD-10-CM | POA: Insufficient documentation

## 2020-10-15 DIAGNOSIS — I10 Essential (primary) hypertension: Secondary | ICD-10-CM | POA: Diagnosis not present

## 2020-10-15 DIAGNOSIS — J302 Other seasonal allergic rhinitis: Secondary | ICD-10-CM | POA: Diagnosis not present

## 2020-10-15 DIAGNOSIS — E785 Hyperlipidemia, unspecified: Secondary | ICD-10-CM | POA: Diagnosis not present

## 2020-10-15 DIAGNOSIS — Z888 Allergy status to other drugs, medicaments and biological substances status: Secondary | ICD-10-CM | POA: Insufficient documentation

## 2020-10-15 DIAGNOSIS — E042 Nontoxic multinodular goiter: Secondary | ICD-10-CM | POA: Diagnosis not present

## 2020-10-15 HISTORY — DX: Nausea with vomiting, unspecified: R11.2

## 2020-10-15 HISTORY — PX: THYROIDECTOMY: SHX17

## 2020-10-15 HISTORY — DX: Other specified postprocedural states: Z98.890

## 2020-10-15 SURGERY — THYROIDECTOMY
Anesthesia: General | Site: Chest | Laterality: Left

## 2020-10-15 MED ORDER — MIDAZOLAM HCL 5 MG/5ML IJ SOLN
INTRAMUSCULAR | Status: DC | PRN
  Administered 2020-10-15: 2 mg via INTRAVENOUS

## 2020-10-15 MED ORDER — ACETAMINOPHEN 500 MG PO TABS
ORAL_TABLET | ORAL | Status: AC
  Filled 2020-10-15: qty 2

## 2020-10-15 MED ORDER — LACTATED RINGERS IV SOLN: INTRAVENOUS | Status: DC

## 2020-10-15 MED ORDER — MIDAZOLAM HCL 2 MG/2ML IJ SOLN
INTRAMUSCULAR | Status: AC
  Filled 2020-10-15: qty 2

## 2020-10-15 MED ORDER — FENTANYL CITRATE (PF) 100 MCG/2ML IJ SOLN
25.0000 ug | INTRAMUSCULAR | Status: DC | PRN
  Administered 2020-10-15: 25 ug via INTRAVENOUS

## 2020-10-15 MED ORDER — CEFAZOLIN SODIUM-DEXTROSE 2-4 GM/100ML-% IV SOLN
2.0000 g | INTRAVENOUS | Status: AC
  Administered 2020-10-15: 2 g via INTRAVENOUS

## 2020-10-15 MED ORDER — DEXAMETHASONE SODIUM PHOSPHATE 10 MG/ML IJ SOLN
INTRAMUSCULAR | Status: AC
  Filled 2020-10-15: qty 1

## 2020-10-15 MED ORDER — SUCCINYLCHOLINE CHLORIDE 200 MG/10ML IV SOSY
PREFILLED_SYRINGE | INTRAVENOUS | Status: DC | PRN
  Administered 2020-10-15: 120 mg via INTRAVENOUS

## 2020-10-15 MED ORDER — SCOPOLAMINE 1 MG/3DAYS TD PT72
MEDICATED_PATCH | TRANSDERMAL | Status: AC
  Filled 2020-10-15: qty 1

## 2020-10-15 MED ORDER — SUCCINYLCHOLINE CHLORIDE 200 MG/10ML IV SOSY
PREFILLED_SYRINGE | INTRAVENOUS | Status: AC
  Filled 2020-10-15: qty 10

## 2020-10-15 MED ORDER — SCOPOLAMINE 1 MG/3DAYS TD PT72
1.0000 | MEDICATED_PATCH | TRANSDERMAL | Status: DC
  Administered 2020-10-15: 1.5 mg via TRANSDERMAL

## 2020-10-15 MED ORDER — ONDANSETRON HCL 4 MG/2ML IJ SOLN
INTRAMUSCULAR | Status: AC
  Filled 2020-10-15: qty 2

## 2020-10-15 MED ORDER — DROPERIDOL 2.5 MG/ML IJ SOLN: 0.6250 mg | Freq: Once | INTRAMUSCULAR | Status: DC | PRN

## 2020-10-15 MED ORDER — LIDOCAINE-EPINEPHRINE 1 %-1:100000 IJ SOLN
INTRAMUSCULAR | Status: DC | PRN
  Administered 2020-10-15: 10 mL

## 2020-10-15 MED ORDER — PROMETHAZINE HCL 25 MG/ML IJ SOLN
6.2500 mg | INTRAMUSCULAR | Status: DC | PRN
  Administered 2020-10-15: 6.25 mg via INTRAVENOUS
  Filled 2020-10-15: qty 1

## 2020-10-15 MED ORDER — DEXAMETHASONE SODIUM PHOSPHATE 10 MG/ML IJ SOLN
INTRAMUSCULAR | Status: DC | PRN
  Administered 2020-10-15: 10 mg via INTRAVENOUS

## 2020-10-15 MED ORDER — ACETAMINOPHEN 500 MG PO TABS
1000.0000 mg | ORAL_TABLET | Freq: Once | ORAL | Status: AC
  Administered 2020-10-15: 1000 mg via ORAL

## 2020-10-15 MED ORDER — PROPOFOL 10 MG/ML IV BOLUS
INTRAVENOUS | Status: AC
  Filled 2020-10-15: qty 40

## 2020-10-15 MED ORDER — LIDOCAINE 2% (20 MG/ML) 5 ML SYRINGE
INTRAMUSCULAR | Status: DC | PRN
  Administered 2020-10-15: 80 mg via INTRAVENOUS

## 2020-10-15 MED ORDER — FENTANYL CITRATE (PF) 100 MCG/2ML IJ SOLN
INTRAMUSCULAR | Status: AC
  Filled 2020-10-15: qty 2

## 2020-10-15 MED ORDER — PROPOFOL 10 MG/ML IV BOLUS
INTRAVENOUS | Status: DC | PRN
  Administered 2020-10-15: 150 mg via INTRAVENOUS

## 2020-10-15 MED ORDER — FENTANYL CITRATE (PF) 100 MCG/2ML IJ SOLN
INTRAMUSCULAR | Status: DC | PRN
  Administered 2020-10-15 (×2): 50 ug via INTRAVENOUS

## 2020-10-15 MED ORDER — LIDOCAINE 2% (20 MG/ML) 5 ML SYRINGE
INTRAMUSCULAR | Status: AC
  Filled 2020-10-15: qty 5

## 2020-10-15 MED ORDER — ATROPINE SULFATE 0.4 MG/ML IJ SOLN
INTRAMUSCULAR | Status: AC
  Filled 2020-10-15: qty 1

## 2020-10-15 MED ORDER — CEFAZOLIN SODIUM-DEXTROSE 2-4 GM/100ML-% IV SOLN
INTRAVENOUS | Status: AC
  Filled 2020-10-15: qty 100

## 2020-10-15 MED ORDER — ONDANSETRON HCL 4 MG/2ML IJ SOLN
INTRAMUSCULAR | Status: DC | PRN
  Administered 2020-10-15: 4 mg via INTRAVENOUS

## 2020-10-15 SURGICAL SUPPLY — 49 items
ADH SKN CLS APL DERMABOND .7 (GAUZE/BANDAGES/DRESSINGS) ×1
BLADE SURG 15 STRL LF DISP TIS (BLADE) IMPLANT
BLADE SURG 15 STRL SS (BLADE)
CANISTER SUCT 1200ML W/VALVE (MISCELLANEOUS) ×2 IMPLANT
CLEANER CAUTERY TIP 5X5 PAD (MISCELLANEOUS) ×1 IMPLANT
CNTNR URN SCR LID CUP LEK RST (MISCELLANEOUS) IMPLANT
CONT SPEC 4OZ STRL OR WHT (MISCELLANEOUS)
CORD BIPOLAR FORCEPS 12FT (ELECTRODE) ×2 IMPLANT
DERMABOND ADVANCED (GAUZE/BANDAGES/DRESSINGS) ×1
DERMABOND ADVANCED .7 DNX12 (GAUZE/BANDAGES/DRESSINGS) ×1 IMPLANT
DRAIN CHANNEL 10F 3/8 F FF (DRAIN) IMPLANT
DRAIN JACKSON RD 7FR 3/32 (WOUND CARE) IMPLANT
DRAIN JP 10F RND SILICONE (MISCELLANEOUS) IMPLANT
DRAPE HALF SHEET 40X57 (DRAPES) IMPLANT
ELECT COATED BLADE 2.86 ST (ELECTRODE) ×2 IMPLANT
ELECT REM PT RETURN 9FT ADLT (ELECTROSURGICAL) ×2
ELECTRODE REM PT RTRN 9FT ADLT (ELECTROSURGICAL) ×1 IMPLANT
EVACUATOR SILICONE 100CC (DRAIN) ×2 IMPLANT
FORCEPS BIPOLAR SPETZLER 8 1.0 (NEUROSURGERY SUPPLIES) ×2 IMPLANT
GAUZE 4X4 16PLY ~~LOC~~+RFID DBL (SPONGE) ×2 IMPLANT
GLOVE SURG ENC MOIS LTX SZ6.5 (GLOVE) IMPLANT
GLOVE SURG ENC MOIS LTX SZ7.5 (GLOVE) IMPLANT
GLOVE SURG POLYISO LF SZ6.5 (GLOVE) ×4 IMPLANT
GLOVE SURG SYN 7.5  E (GLOVE) ×2
GLOVE SURG SYN 7.5 E (GLOVE) ×1 IMPLANT
GLOVE SURG UNDER POLY LF SZ7 (GLOVE) ×4 IMPLANT
GOWN STRL REUS W/ TWL LRG LVL3 (GOWN DISPOSABLE) ×3 IMPLANT
GOWN STRL REUS W/TWL LRG LVL3 (GOWN DISPOSABLE) ×6
HEMOSTAT SURGICEL 2X14 (HEMOSTASIS) ×2 IMPLANT
LOCATOR NERVE 3 VOLT (DISPOSABLE) IMPLANT
NEEDLE HYPO 25GX1X1/2 BEV (NEEDLE) ×2 IMPLANT
NS IRRIG 1000ML POUR BTL (IV SOLUTION) ×2 IMPLANT
PACK BASIN DAY SURGERY FS (CUSTOM PROCEDURE TRAY) ×2 IMPLANT
PACK ENT DAY SURGERY (CUSTOM PROCEDURE TRAY) ×2 IMPLANT
PAD CLEANER CAUTERY TIP 5X5 (MISCELLANEOUS) ×1
PENCIL SMOKE EVACUATOR (MISCELLANEOUS) ×2 IMPLANT
POSITIONER HEAD DONUT 9IN (MISCELLANEOUS) IMPLANT
SET WALTER ACTIVATION W/DRAPE (SET/KITS/TRAYS/PACK) ×2 IMPLANT
SHEARS HARMONIC 9CM CVD (BLADE) ×2 IMPLANT
SPONGE INTESTINAL PEANUT (DISPOSABLE) ×2 IMPLANT
STAPLER VISISTAT 35W (STAPLE) ×2 IMPLANT
SUT ETHILON 2 0 FS 18 (SUTURE) ×2 IMPLANT
SUT SILK 3 0 REEL (SUTURE) ×2 IMPLANT
SUT VIC AB 3-0 SH 27 (SUTURE) ×2
SUT VIC AB 3-0 SH 27X BRD (SUTURE) ×1 IMPLANT
SUT VICRYL 4-0 PS2 18IN ABS (SUTURE) ×2 IMPLANT
SYR BULB EAR ULCER 3OZ GRN STR (SYRINGE) ×2 IMPLANT
TOWEL GREEN STERILE FF (TOWEL DISPOSABLE) ×2 IMPLANT
TUBE FEEDING 8FR 16IN STR KANG (MISCELLANEOUS) IMPLANT

## 2020-10-15 NOTE — Transfer of Care (Signed)
Immediate Anesthesia Transfer of Care Note  Patient: Joshua Norris  Procedure(s) Performed: LEFT THYROID LOBECTOMY (Left: Chest)  Patient Location: PACU  Anesthesia Type:General  Level of Consciousness: awake, alert  and oriented  Airway & Oxygen Therapy: Patient Spontanous Breathing and Patient connected to face mask oxygen  Post-op Assessment: Report given to RN and Post -op Vital signs reviewed and stable  Post vital signs: Reviewed and stable  Last Vitals:  Vitals Value Taken Time  BP 166/100 10/15/20 0924  Temp    Pulse 51 10/15/20 0925  Resp 4 10/15/20 0925  SpO2 99 % 10/15/20 0925  Vitals shown include unvalidated device data.  Last Pain:  Vitals:   10/15/20 0622  TempSrc: Oral  PainSc: 0-No pain      Patients Stated Pain Goal: 3 (62/56/38 [id number])  Complications: No notable events documented.

## 2020-10-15 NOTE — H&P (Signed)
Joshua Norris is an 55 y.o. male.   Chief Complaint: Thyroid cyst HPI: 55 year old male with recent acute swelling of his left thyroid demonstrated to be a cyst.  It has shrunk some since first evaluation.  Past Medical History:  Diagnosis Date  . Allergy   . Arthritis    Joints  . 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 Complication of anesthesia    n/v  . Family history of adverse reaction to anesthesia    Mother- N/V  . Lipoma 10/27/2008   Excised from behind R arm    . PONV (postoperative nausea and vomiting)   . Seasonal allergies     Past Surgical History:  Procedure Laterality Date  . COLONOSCOPY    . COLONOSCOPY    . COLONOSCOPY W/ POLYPECTOMY     age 89  . HIP SURGERY Right 2005-05-03   right; torn labrum  . LUMBAR FUSION     L4-L5 late May 03, 2016  . ROTATOR CUFF REPAIR Right    avulsed infraspinatus and supraspinatus    Family History  Problem Relation Age of Onset  . Hypertension Mother   . Sleep apnea Mother        overweight  . Emphysema Mother        smoker  . Psoriasis Mother        arthritis  . Colon cancer Father 4       end stage. died age 31 in May 04, 2014.   Marland Kitchen Pancreatic cancer Father 6       asymptomatic at presentation  . Prostate cancer Father 68  . Melanoma Father 82  . Prostate cancer Brother 42       now metastatic  . Crohn's disease Brother   . Tuberculosis Brother        inactive  . Rheum arthritis Maternal Grandmother   . Stomach cancer Neg Hx   . Esophageal cancer Neg Hx    Social History:  reports that he has never smoked. He quit smokeless tobacco use about 13 years ago. He reports current alcohol use of about 3.0 standard drinks per week. He reports that he does not use drugs.  Allergies:  Allergies  Allergen Reactions  . Percocet [Oxycodone-Acetaminophen] Nausea And Vomiting    Unsure if anesthesia or Percocet  . Tramadol Nausea And Vomiting  . Dilaudid [Hydromorphone Hcl] Nausea And Vomiting  . Latex Other (See Comments)    Canker  sores from dental procedures from gloves  . Other Nausea And Vomiting    Severe nausea /vomiting from anesthesia or dilaudid.  Not sure which one caused it.     Medications Prior to Admission  Medication Sig Dispense Refill  . B Complex Vitamins (B COMPLEX-B12 PO) Take 1 tablet by mouth daily.     . cholecalciferol (VITAMIN D) 1000 UNITS tablet Take 1,000 Units by mouth daily.    . Flaxseed, Linseed, (FLAX SEEDS PO) Take 1 tablet by mouth daily.     Marland Kitchen glucosamine-chondroitin 500-400 MG tablet Take 2 tablets by mouth daily.     Marland Kitchen GRAPE SEED EXTRACT PO Take 1 tablet by mouth daily.     Marland Kitchen ibuprofen (ADVIL,MOTRIN) 200 MG tablet Take 800 mg by mouth 2 (two) times daily.    . Misc Natural Products (BEE PROPOLIS PO) Take 1,000 mg by mouth 2 (two) times daily.    Marland Kitchen oxymetazoline (AFRIN) 0.05 % nasal spray Place 1 spray into both nostrils 2 (two) times daily as needed  for congestion.    . tadalafil (CIALIS) 20 MG tablet Take 1 tablet (20 mg total) by mouth every other day. 15 tablet 11  . testosterone (ANDROGEL) 50 MG/5GM (1%) GEL PLACE 2.5 GRAMS ONTO THE SKIN DAILY. 150 g 3    No results found for this or any previous visit (from the past 48 hour(s)). No results found.  Review of Systems  All other systems reviewed and are negative.  Blood pressure (!) 135/91, temperature 98 F (36.7 C), temperature source Oral, resp. rate 16, height 5\' 10"  (1.778 m), weight 82.9 kg, SpO2 97 %. Physical Exam Constitutional:      Appearance: Normal appearance. He is normal weight.  HENT:     Head: Normocephalic and atraumatic.     Right Ear: External ear normal.     Left Ear: External ear normal.     Nose: Nose normal.     Mouth/Throat:     Mouth: Mucous membranes are moist.     Pharynx: Oropharynx is clear.  Eyes:     Extraocular Movements: Extraocular movements intact.     Conjunctiva/sclera: Conjunctivae normal.     Pupils: Pupils are equal, round, and reactive to light.  Neck:     Comments: Left  thyroid fullness. Cardiovascular:     Rate and Rhythm: Normal rate.  Pulmonary:     Effort: Pulmonary effort is normal.  Musculoskeletal:     Cervical back: Normal range of motion.  Skin:    General: Skin is warm and dry.  Neurological:     General: No focal deficit present.     Mental Status: He is alert and oriented to person, place, and time.  Psychiatric:        Mood and Affect: Mood normal.        Behavior: Behavior normal.        Thought Content: Thought content normal.        Judgment: Judgment normal.     Assessment/Plan Left thyroid cyst  To OR for left thyroid lobectomy.  Melida Quitter, MD 10/15/2020, 7:38 AM

## 2020-10-15 NOTE — Anesthesia Procedure Notes (Signed)
Procedure Name: Intubation Date/Time: 10/15/2020 7:50 AM Performed by: Lavonia Dana, CRNA Pre-anesthesia Checklist: Patient identified, Emergency Drugs available, Suction available and Patient being monitored Patient Re-evaluated:Patient Re-evaluated prior to induction Oxygen Delivery Method: Circle system utilized Preoxygenation: Pre-oxygenation with 100% oxygen Induction Type: IV induction Ventilation: Mask ventilation without difficulty Laryngoscope Size: Mac and 4 Grade View: Grade I Tube type: Oral Tube size: 7.5 mm Number of attempts: 1 Airway Equipment and Method: Stylet and Oral airway Placement Confirmation: ETT inserted through vocal cords under direct vision, positive ETCO2 and breath sounds checked- equal and bilateral Secured at: 24 cm Tube secured with: Tape Dental Injury: Teeth and Oropharynx as per pre-operative assessment

## 2020-10-15 NOTE — Op Note (Signed)
Preop diagnosis: Left thyroid cyst Postop diagnosis: same Procedure: Left thyroid lobectomy Surgeon: Redmond Baseman Assist: None Anesth: General and local with 1% lidocaine with 1:100,000 epinephrine Compl: None Findings: Left-sided thyroid nodule/cyst, approximately 5 cm Description:  After discussing risks, benefits, and alternatives, the patient was brought to the operative suite and placed on the operative table in the supine position.  Anesthesia was induced and the patient was intubated by the anesthesia team without difficulty.  The eyes were taped closed and a shoulder roll was placed.  The thyroid incision was marked with a marking pen and injected with local anesthetic.  The neck was prepped and draped in sterile fashion.  The incision was made with a 15 blade scalpel and extended through the subcutaneous and platysma layers with Bovie electrocautery.  The midline raphe of the strap muscles was divided and strap muscles retracted over the left lobe.  Dissection was then performed around the lobe using the harmonic scalpel includig division of the superior and inferior pedicles and dissection around the lateral aspect.  The inferior parathyroid gland was visualized and left in place.  The superior was not visualized.  Dissection continued around the deep aspect of the lobe while retracting the lobe.  Staying along the capsule, the dissection continued to Berry's ligament that was dissected.  The gland was then incised to the left of midline using the Harmonic scalpel allowing removal of the lobe.  It was passed to nursing for pathology.  The recurrent laryngeal nerve was not seen during the case.  Bleeding was controlled with Bipolar electrocautery after a Valsalva was given.  After irrigating copiously, a strip of Surgicell was laid in the depth of the wound.  The strap muscles were closed with a single 3-0 Vicryl suture.  The platysma layer was closed with 3-0 Vicryl in a simple, interrupted fashion.  The  subcutaneous layer was similarly closed with 4-0 Vicryl and the skin was closed with glue.  Drapes were removed and the patient was cleaned off.  He was returned to anesthesia for wake-up, was extubated, and moved to the recovery room in stable condition.

## 2020-10-15 NOTE — Anesthesia Postprocedure Evaluation (Signed)
Anesthesia Post Note  Patient: Joshua Norris  Procedure(s) Performed: LEFT THYROID LOBECTOMY (Left: Chest)     Patient location during evaluation: PACU Anesthesia Type: General Level of consciousness: sedated and awake Pain management: pain level controlled Vital Signs Assessment: post-procedure vital signs reviewed and stable Respiratory status: spontaneous breathing and respiratory function stable Cardiovascular status: stable Postop Assessment: no apparent nausea or vomiting Anesthetic complications: no   No notable events documented.  Last Vitals:  Vitals:   10/15/20 1055 10/15/20 1126  BP: (!) 171/101 (!) 168/87  Pulse: (!) 44 (!) 46  Resp:  17  Temp:  36.6 C  SpO2: 97% 97%    Last Pain:  Vitals:   10/15/20 1126  TempSrc:   PainSc: 7                  Candra R Evetta Renner

## 2020-10-15 NOTE — Brief Op Note (Signed)
10/15/2020  9:11 AM  PATIENT:  Joshua Norris  55 y.o. male  PRE-OPERATIVE DIAGNOSIS:  Thyroid cyst  POST-OPERATIVE DIAGNOSIS:  Thyroid cyst  PROCEDURE:  Procedure(s): LEFT THYROID LOBECTOMY (Left)  SURGEON:  Surgeon(s) and Role:    Melida Quitter, MD - Primary  PHYSICIAN ASSISTANT:   ASSISTANTS: none   ANESTHESIA:   general  EBL:  Minimal   BLOOD ADMINISTERED:none  DRAINS: none   LOCAL MEDICATIONS USED:  LIDOCAINE   SPECIMEN:  Source of Specimen:  left thyroid lobe  DISPOSITION OF SPECIMEN:  PATHOLOGY  COUNTS:  YES  TOURNIQUET:  * No tourniquets in log *  DICTATION: .Note written in EPIC  PLAN OF CARE: Discharge to home after PACU  PATIENT DISPOSITION:  PACU - hemodynamically stable.   Delay start of Pharmacological VTE agent (>24hrs) due to surgical blood loss or risk of bleeding: no

## 2020-10-15 NOTE — Discharge Instructions (Addendum)
Next dose of Tylenol after 12:26pm as needed for pain.   Post Anesthesia Home Care Instructions  Activity: Get plenty of rest for the remainder of the day. A responsible individual must stay with you for 24 hours following the procedure.  For the next 24 hours, DO NOT: -Drive a car -Paediatric nurse -Drink alcoholic beverages -Take any medication unless instructed by your physician -Make any legal decisions or sign important papers.  Meals: Start with liquid foods such as gelatin or soup. Progress to regular foods as tolerated. Avoid greasy, spicy, heavy foods. If nausea and/or vomiting occur, drink only clear liquids until the nausea and/or vomiting subsides. Call your physician if vomiting continues.  Special Instructions/Symptoms: Your throat may feel dry or sore from the anesthesia or the breathing tube placed in your throat during surgery. If this causes discomfort, gargle with warm salt water. The discomfort should disappear within 24 hours.  If you had a scopolamine patch placed behind your ear for the management of post- operative nausea and/or vomiting:  1. The medication in the patch is effective for 72 hours, after which it should be removed.  Wrap patch in a tissue and discard in the trash. Wash hands thoroughly with soap and water. 2. You may remove the patch earlier than 72 hours if you experience unpleasant side effects which may include dry mouth, dizziness or visual disturbances. 3. Avoid touching the patch. Wash your hands with soap and water after contact with the patch.

## 2020-10-17 LAB — SURGICAL PATHOLOGY

## 2020-10-18 ENCOUNTER — Encounter (HOSPITAL_BASED_OUTPATIENT_CLINIC_OR_DEPARTMENT_OTHER): Payer: Self-pay | Admitting: Otolaryngology

## 2020-11-21 ENCOUNTER — Other Ambulatory Visit: Payer: Self-pay | Admitting: Family Medicine

## 2020-11-21 NOTE — Telephone Encounter (Signed)
RX Refill: testosterone Last Seen: 02-23-20 Last Ordered: 07-16-20 Next Appt: NA

## 2020-12-25 DIAGNOSIS — M47812 Spondylosis without myelopathy or radiculopathy, cervical region: Secondary | ICD-10-CM | POA: Diagnosis not present

## 2021-01-03 DIAGNOSIS — L720 Epidermal cyst: Secondary | ICD-10-CM | POA: Diagnosis not present

## 2021-01-15 DIAGNOSIS — M47812 Spondylosis without myelopathy or radiculopathy, cervical region: Secondary | ICD-10-CM | POA: Diagnosis not present

## 2021-01-29 ENCOUNTER — Encounter: Payer: Self-pay | Admitting: Family Medicine

## 2021-02-06 MED ORDER — TESTOSTERONE 12.5 MG/ACT (1%) TD GEL: 2.0000 | Freq: Every day | TRANSDERMAL | 5 refills | Status: DC

## 2021-02-18 MED ORDER — TESTIM 50 MG/5GM (1%) TD GEL: 5.0000 g | Freq: Every day | TRANSDERMAL | 5 refills | Status: DC

## 2021-02-22 ENCOUNTER — Other Ambulatory Visit: Payer: Self-pay

## 2021-02-22 NOTE — Progress Notes (Signed)
Phone [phone] In person visit   Subjective:   Joshua Norris is a 56 y.o. year old very pleasant male patient who presents for/with See problem oriented charting Chief Complaint  Patient presents with   Hypertension   This visit occurred during the SARS-CoV-2 public health emergency.  Safety protocols were in place, including screening questions prior to the visit, additional usage of staff PPE, and extensive cleaning of exam room while observing appropriate contact time as indicated for disinfecting solutions.   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      cholecalciferol (VITAMIN D) 1000 UNITS tablet Take 1,000 Units by mouth daily.     glucosamine-chondroitin 500-400 MG tablet Take 2 tablets by mouth daily.      ibuprofen (ADVIL,MOTRIN) 200 MG tablet Take 800 mg by mouth 2 (two) times daily.     Misc Natural Products (BEE PROPOLIS PO) Take 1,000 mg by mouth 2 (two) times daily.     oxymetazoline (AFRIN) 0.05 % nasal spray Place 1 spray into both nostrils 2 (two) times daily as needed for congestion.     tadalafil (CIALIS) 20 MG tablet Take 1 tablet (20 mg total) by mouth every other day. 15 tablet 11    TESTIM 50 MG/5GM (1%) GEL Place 5 g onto the skin daily. 150 g 5   Flaxseed, Linseed, (FLAX SEEDS PO) Take 1 tablet by mouth daily.  (Patient not taking: Reported on 03/01/2021)     No current facility-administered medications for this visit.     Objective:  BP 138/89    Pulse (!) 58    Temp 98.7 F (37.1 C) (Temporal)    Ht 5\' 10"  (1.778 m)    Wt 187 lb 9.6 oz (85.1 kg)    SpO2 98%    BMI 26.92 kg/m  Gen: NAD, resting comfortably CV: RRR no murmurs rubs or gallops Lungs: CTAB no crackles, wheeze, rhonchi Ext: no edema Skin: warm, dry    Assessment and Plan    #Low testosterone/testicular hypofunction S: Patient compliant with testosterone gel until we ran into insurance coverage issues.  He reports off for several months- feels more tired, less energy, some weight gain, not a drastic loss of muscle mass.  Last took at end of December. - tadalafil helped with delayed ejaculation. He is not suffering from decreased libido.   A/P: off testosterone- update levels today and I am going to keep hims form in my office for when we get this back can submit to insurance for the testim 50mg /5 gm- they are reqesting labs within last year and last testosterone was 02/23/20 .  On other hand he has letter stating approved through 2039- so not sure why this is needed but will comply. We are trying to restart the testim but with significant barriers- restart as soon as able.     #Neck pain/right arm pain/high risk medication use-patient with history of injections with Dr. Herma Mering which were very helpful- had not needed lately. Doing accupuncture as well and that was helpful.  Last visit was using ibuprofen 800 mg daily- at 02/2020 visit, he reported taking most days at least once a day.  -We discussed if taking ibuprofen daily he needs to have kidney function checked at least every 6 months   #hyperlipidemia S: Medication:None Lab Results  Component Value Date   CHOL 186 09/21/2019   HDL 48 09/21/2019    LDLCALC 116 (H) 09/21/2019   LDLDIRECT 135.7 12/27/2009   TRIG 112 09/21/2019   CHOLHDL 3.9 09/21/2019   A/P: 10-year ASCVD risk is 6.1%.  Discussed as long as under 7.5% continue to work on healthy eating/regular exercise/weight loss  # Hyperglycemia/insulin resistance/prediabetes- usually chewed gum- with cbgs slightly high-A1c thankfully okay in 2022 Lab Results  Component Value Date   HGBA1C 5.6 02/23/2020   - we opted to hold off this time  #hypertension S: medication: Controlled without medication though high normal Home readings #s: 128/78 at home BP Readings from Last 3 Encounters:  03/01/21 138/89  10/15/20 (!) 168/87  02/23/20 130/84  A/P: here High normal, better at home-we discussed the goals less than 135/85 to prevent nonfatal heart attack but he is at least under range for reducing risk of mortality related to blood pressure-patient knows to continue without medicine- doing a great job with lifestyle   #essential tremor- slightly worsened intermittently. Worsened with caffeine. Not bad enough to want to take medicine for it   #Thyroid cyst- left thyroid lobectomy due to cyst enlargement- continue current meds Lab Results  Component Value Date   TSH 1.38 09/25/2015   #HM- discussed shingrix- plans around 60  Recommended follow up: Return for next already scheduled visit. Future Appointments  Date Time Provider [place]  07/26/2021  1:00 PM Marin Olp, MD [facility]   Lab/Order associations:   ICD-10-CM   1. Essential hypertension  I10     2. Hyperlipidemia, unspecified hyperlipidemia type  E78.5 CBC with Differential/Platelet    Comprehensive metabolic panel    Lipid panel    Lipid panel    Comprehensive metabolic panel    CBC with Differential/Platelet    3. Testicular hypofunction  E29.1 Testosterone    Testosterone    4. Hyperglycemia  R73.9     5. Screening for prostate cancer  Z12.5 PSA    PSA    6. History of partial  thyroidectomy  E89.0 TSH    TSH     No orders of the defined types were placed in this encounter.  Time Spent: 33 minutes of total time (4:10 PM- 4:43 PM) was spent on the date of the encounter performing the following actions: chart review prior to seeing the patient, obtaining history, performing a medically necessary exam, counseling on the treatment plan, placing orders, and documenting in our EHR.   I,Jada Bradford,acting as a scribe for Garret Reddish, MD.,have documented all relevant documentation on the behalf of Garret Reddish, MD,as directed by  Garret Reddish, MD while in the presence of Garret Reddish, MD.  I, Garret Reddish, MD, have reviewed all documentation for this visit. The documentation on 03/01/21 for the  exam, diagnosis, procedures, and orders are all accurate and complete.  Return precautions advised.  Garret Reddish, MD

## 2021-03-01 ENCOUNTER — Other Ambulatory Visit: Payer: Self-pay

## 2021-03-01 ENCOUNTER — Encounter: Payer: Self-pay | Admitting: Family Medicine

## 2021-03-01 ENCOUNTER — Ambulatory Visit: Payer: BC Managed Care – PPO | Admitting: Family Medicine

## 2021-03-01 VITALS — BP 138/89 | HR 58 | Temp 98.7°F | Ht 70.0 in | Wt 187.6 lb

## 2021-03-01 DIAGNOSIS — E890 Postprocedural hypothyroidism: Secondary | ICD-10-CM

## 2021-03-01 DIAGNOSIS — R739 Hyperglycemia, unspecified: Secondary | ICD-10-CM | POA: Diagnosis not present

## 2021-03-01 DIAGNOSIS — Z125 Encounter for screening for malignant neoplasm of prostate: Secondary | ICD-10-CM

## 2021-03-01 DIAGNOSIS — I10 Essential (primary) hypertension: Secondary | ICD-10-CM

## 2021-03-01 DIAGNOSIS — E291 Testicular hypofunction: Secondary | ICD-10-CM | POA: Diagnosis not present

## 2021-03-01 DIAGNOSIS — E785 Hyperlipidemia, unspecified: Secondary | ICD-10-CM

## 2021-03-01 NOTE — Patient Instructions (Addendum)
°  Please stop by lab before you go If you have mychart- we will send your results within 3 business days of [place] receiving them.  If you do not have mychart- we will call you about results within 5 business days of [place] receiving them.  *please also note that you will see labs on mychart as soon as they post. I will later go in and write notes on them- will say "notes from Dr. Yong Channel"   Recommended follow up: Return for next already scheduled visit.

## 2021-03-02 LAB — LIPID PANEL
Cholesterol: 223 mg/dL — ABNORMAL HIGH (ref <200)
HDL: 51 mg/dL (ref >=40)
LDL Cholesterol (Calc): 139 mg/dL (calc) — ABNORMAL HIGH
Non-HDL Cholesterol (Calc): 172 mg/dL (calc) — ABNORMAL HIGH (ref <130)
Total CHOL/HDL Ratio: 4.4 (calc) (ref <5.0)
Triglycerides: 194 mg/dL — ABNORMAL HIGH (ref <150)

## 2021-03-02 LAB — CBC WITH DIFFERENTIAL/PLATELET
Absolute Monocytes: 695 cells/uL (ref 200–950)
Basophils Absolute: 40 cells/uL (ref 0–200)
Basophils Relative: 0.5 %
Eosinophils Absolute: 261 cells/uL (ref 15–500)
Eosinophils Relative: 3.3 %
HCT: 44.3 % (ref 38.5–50.0)
Hemoglobin: 15.2 g/dL (ref 13.2–17.1)
Lymphs Abs: 1864 cells/uL (ref 850–3900)
MCH: 29.9 pg (ref 27.0–33.0)
MCHC: 34.3 g/dL (ref 32.0–36.0)
MCV: 87 fL (ref 80.0–100.0)
MPV: 10.1 fL (ref 7.5–12.5)
Monocytes Relative: 8.8 %
Neutro Abs: 5040 cells/uL (ref 1500–7800)
Neutrophils Relative %: 63.8 %
Platelets: 276 10*3/uL (ref 140–400)
RBC: 5.09 10*6/uL (ref 4.20–5.80)
RDW: 12.7 % (ref 11.0–15.0)
Total Lymphocyte: 23.6 %
WBC: 7.9 10*3/uL (ref 3.8–10.8)

## 2021-03-02 LAB — COMPREHENSIVE METABOLIC PANEL
AG Ratio: 1.9 (calc) (ref 1.0–2.5)
ALT: 24 U/L (ref 9–46)
AST: 19 U/L (ref 10–35)
Albumin: 4.4 g/dL (ref 3.6–5.1)
Alkaline phosphatase (APISO): 45 U/L (ref 35–144)
BUN/Creatinine Ratio: 13 (calc) (ref 6–22)
BUN: 22 mg/dL (ref 7–25)
CO2: 26 mmol/L (ref 20–32)
Calcium: 9.5 mg/dL (ref 8.6–10.3)
Chloride: 105 mmol/L (ref 98–110)
Creat: 1.65 mg/dL — ABNORMAL HIGH (ref 0.70–1.30)
Globulin: 2.3 g/dL (calc) (ref 1.9–3.7)
Glucose, Bld: 96 mg/dL (ref 65–99)
Potassium: 4.4 mmol/L (ref 3.5–5.3)
Sodium: 140 mmol/L (ref 135–146)
Total Bilirubin: 0.5 mg/dL (ref 0.2–1.2)
Total Protein: 6.7 g/dL (ref 6.1–8.1)

## 2021-03-02 LAB — PSA: PSA: 3.03 ng/mL (ref <=4.00)

## 2021-03-02 LAB — TESTOSTERONE: Testosterone: 368 ng/dL (ref 250–827)

## 2021-03-02 LAB — TSH: TSH: 2.3 mIU/L (ref 0.40–4.50)

## 2021-03-04 ENCOUNTER — Encounter: Payer: Self-pay | Admitting: Family Medicine

## 2021-03-05 ENCOUNTER — Other Ambulatory Visit: Payer: Self-pay

## 2021-03-05 DIAGNOSIS — R7989 Other specified abnormal findings of blood chemistry: Secondary | ICD-10-CM

## 2021-03-05 DIAGNOSIS — R972 Elevated prostate specific antigen [PSA]: Secondary | ICD-10-CM

## 2021-03-19 DIAGNOSIS — L738 Other specified follicular disorders: Secondary | ICD-10-CM | POA: Diagnosis not present

## 2021-03-19 DIAGNOSIS — D225 Melanocytic nevi of trunk: Secondary | ICD-10-CM | POA: Diagnosis not present

## 2021-03-19 DIAGNOSIS — Z85820 Personal history of malignant melanoma of skin: Secondary | ICD-10-CM | POA: Diagnosis not present

## 2021-03-19 DIAGNOSIS — L821 Other seborrheic keratosis: Secondary | ICD-10-CM | POA: Diagnosis not present

## 2021-03-28 ENCOUNTER — Other Ambulatory Visit (INDEPENDENT_AMBULATORY_CARE_PROVIDER_SITE_OTHER): Payer: BC Managed Care – PPO

## 2021-03-28 DIAGNOSIS — R7989 Other specified abnormal findings of blood chemistry: Secondary | ICD-10-CM | POA: Diagnosis not present

## 2021-03-28 DIAGNOSIS — R972 Elevated prostate specific antigen [PSA]: Secondary | ICD-10-CM | POA: Diagnosis not present

## 2021-03-28 LAB — COMPREHENSIVE METABOLIC PANEL WITH GFR
ALT: 21 U/L (ref 0–53)
AST: 20 U/L (ref 0–37)
Albumin: 4.5 g/dL (ref 3.5–5.2)
Alkaline Phosphatase: 49 U/L (ref 39–117)
BUN: 17 mg/dL (ref 6–23)
CO2: 28 meq/L (ref 19–32)
Calcium: 9.4 mg/dL (ref 8.4–10.5)
Chloride: 102 meq/L (ref 96–112)
Creatinine, Ser: 1.01 mg/dL (ref 0.40–1.50)
GFR: 83.85 mL/min
Glucose, Bld: 115 mg/dL — ABNORMAL HIGH (ref 70–99)
Potassium: 4.4 meq/L (ref 3.5–5.1)
Sodium: 138 meq/L (ref 135–145)
Total Bilirubin: 0.8 mg/dL (ref 0.2–1.2)
Total Protein: 6.7 g/dL (ref 6.0–8.3)

## 2021-03-28 LAB — PSA: PSA: 2.69 ng/mL (ref 0.10–4.00)

## 2021-03-28 LAB — TESTOSTERONE: Testosterone: 392.3 ng/dL (ref 300.00–890.00)

## 2021-03-29 ENCOUNTER — Other Ambulatory Visit: Payer: Self-pay

## 2021-03-29 DIAGNOSIS — R972 Elevated prostate specific antigen [PSA]: Secondary | ICD-10-CM

## 2021-04-25 ENCOUNTER — Encounter: Payer: Self-pay | Admitting: Family Medicine

## 2021-05-07 ENCOUNTER — Other Ambulatory Visit: Payer: Self-pay | Admitting: Family Medicine

## 2021-07-12 DIAGNOSIS — R972 Elevated prostate specific antigen [PSA]: Secondary | ICD-10-CM | POA: Diagnosis not present

## 2021-07-12 DIAGNOSIS — Z0100 Encounter for examination of eyes and vision without abnormal findings: Secondary | ICD-10-CM | POA: Diagnosis not present

## 2021-07-19 ENCOUNTER — Other Ambulatory Visit: Payer: Self-pay | Admitting: Urology

## 2021-07-19 DIAGNOSIS — R972 Elevated prostate specific antigen [PSA]: Secondary | ICD-10-CM

## 2021-07-26 ENCOUNTER — Ambulatory Visit (INDEPENDENT_AMBULATORY_CARE_PROVIDER_SITE_OTHER): Payer: BC Managed Care – PPO | Admitting: Family Medicine

## 2021-07-26 ENCOUNTER — Encounter: Payer: Self-pay | Admitting: Family Medicine

## 2021-07-26 VITALS — BP 120/72 | HR 61 | Temp 98.6°F | Ht 70.0 in | Wt 188.8 lb

## 2021-07-26 DIAGNOSIS — Z0000 Encounter for general adult medical examination without abnormal findings: Secondary | ICD-10-CM

## 2021-07-26 DIAGNOSIS — E785 Hyperlipidemia, unspecified: Secondary | ICD-10-CM | POA: Diagnosis not present

## 2021-07-26 DIAGNOSIS — I10 Essential (primary) hypertension: Secondary | ICD-10-CM

## 2021-07-26 DIAGNOSIS — R739 Hyperglycemia, unspecified: Secondary | ICD-10-CM | POA: Diagnosis not present

## 2021-07-26 NOTE — Progress Notes (Signed)
Phone: [phone]   Subjective:  Patient presents today for their annual physical. Chief complaint-noted.   See problem oriented charting- ROS- full  review of systems was completed and negative  except for: Some emptying issues and has urinary frequency, some decreased flow- does better with water vs more caffeine  The following were reviewed and entered/updated in epic: Past Medical History:  Diagnosis Date   Allergy    Arthritis    Joints   Cancer ([place])    pre cancer- skin   Complication of anesthesia    n/v   Family history of adverse reaction to anesthesia    Mother- N/V   Lipoma 10/27/2008   Excised from behind R arm     PONV (postoperative nausea and vomiting)    Seasonal allergies    Patient Active Problem List   Diagnosis Date Noted   S/P lumbar spinal fusion 07/24/2016    Priority: High   History of melanoma- [place] derm at least every 6 months 03/19/2018    Priority: Medium    Hyperlipidemia 10/07/2016    Priority: Medium    Hyperglycemia 02/15/2010    Priority: Medium    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    Priority: Medium    Essential hypertension 07/27/2007    Priority: Medium    Family history of melanoma 03/16/2014    Priority: Low   History of colonic polyps 03/16/2014    Priority: Low   Osteoarthritis 03/16/2014    Priority: Low   Allergic rhinitis 07/27/2007    Priority: Low   Past Surgical History:  Procedure Laterality Date   COLONOSCOPY     COLONOSCOPY     COLONOSCOPY W/ POLYPECTOMY     age 61   HIP SURGERY Right 04-27-05   right; torn labrum   LUMBAR FUSION     L4-L5 late 04-27-2016   ROTATOR CUFF REPAIR Right    avulsed infraspinatus and supraspinatus   THYROIDECTOMY Left 10/15/2020   Procedure: LEFT THYROID LOBECTOMY;  Surgeon: Melida Quitter, MD;  Location: [facility];  Service: ENT;  Laterality: Left;    Family History  Problem Relation Age of Onset   Hypertension Mother    Sleep apnea Mother        overweight    Emphysema Mother        smoker   Psoriasis Mother        arthritis   Colon cancer Father 60       end stage. died age 15 in 28-Apr-2014.    Pancreatic cancer Father 39       asymptomatic at presentation   Prostate cancer Father 29   Melanoma Father 28   Prostate cancer Brother 54       now metastatic   Crohn's disease Brother    Tuberculosis Brother        inactive   Rheum arthritis Maternal Grandmother    Stomach cancer Neg Hx    Esophageal cancer Neg Hx     Medications- reviewed and updated Current Outpatient Medications  Medication Sig Dispense Refill   B Complex Vitamins (B COMPLEX-B12 PO) Take 1 tablet by mouth daily.      cholecalciferol (VITAMIN D) 1000 UNITS tablet Take 1,000 Units by mouth daily.     glucosamine-chondroitin 500-400 MG tablet Take 2 tablets by mouth daily.      ibuprofen (ADVIL,MOTRIN) 200 MG tablet Take 800 mg by mouth 2 (two) times daily.     Misc Natural Products (BEE PROPOLIS PO) Take 1,000  mg by mouth 2 (two) times daily.     oxymetazoline (AFRIN) 0.05 % nasal spray Place 1 spray into both nostrils 2 (two) times daily as needed for congestion.     tadalafil (CIALIS) 20 MG tablet TAKE ONE TABLET BY MOUTH EVERY OTHER DAY 15 tablet 11   No current facility-administered medications for this visit.    Allergies-reviewed and updated Allergies  Allergen Reactions   Percocet [Oxycodone-Acetaminophen] Nausea And Vomiting    Unsure if anesthesia or Percocet   Tramadol Nausea And Vomiting   Dilaudid [Hydromorphone Hcl] Nausea And Vomiting   Latex Other (See Comments)    Canker sores from dental procedures from gloves   Other Nausea And Vomiting    Severe nausea /vomiting from anesthesia or dilaudid.  Not sure which one caused it.     Social History   Social History Narrative   Married. 1 son from 65st marriage 56 years old in 2016.       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 Oropharynx normal Neck: no thyromegaly CV: RRR no murmurs rubs or gallops Lungs: CTAB no crackles, wheeze, rhonchi Abdomen: soft/nontender/nondistended/normal bowel sounds. No rebound or guarding.  Ext: no edema Skin: warm, dry Neuro: grossly normal, moves all extremities, PERRLA    Assessment and Plan  56 y.o. male presenting for annual physical.  Health Maintenance counseling: 1. Anticipatory guidance: Patient counseled regarding regular dental exams -q6 months, eye exams -yearly,  avoiding smoking and second hand smoke , limiting alcohol to 2 beverages per day - less than 2 a week, no illicit drugs .   2. Risk factor reduction:  Advised patient of need for regular exercise and diet rich and fruits and vegetables to reduce risk of heart attack and stroke.  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  Diet/weight management-very healthy diet still.  Wt Readings from Last 3 Encounters:  07/26/21 188 lb 12.8 oz (85.6 kg)  03/01/21 187 lb 9.6 oz (85.1 kg)  10/15/20 182 lb 12.2 oz (82.9 kg)  3. Immunizations/screenings/ancillary studies-plans on Shingrix at age 70, hold off on further covid vaccinations per his preference  Immunization History  Administered Date(s) Administered   Influenza-Unspecified 10/04/2013, 11/28/2015   PFIZER(Purple Top)SARS-COV-2 Vaccination 04/14/2019, 05/05/2019, 12/27/2019   Td 07/27/2007   Tdap 03/18/2018  4. Prostate cancer screening- working with urology. reports MRI of the prostate planned on July 18. Some emptying issues and has urinary frequency, some decreased flow.  Lab Results  Component Value Date   PSA 2.69 03/28/2021   PSA 3.03 03/01/2021   PSA 1.31 02/23/2020   5. Colon cancer  screening - 12/2016 with 5 year repeat- due later this year. He has asked in past about CEA in future potentially.  6. Skin cancer screening- History of melanoma on right low back-follows with dermatology regularly q6 months.  advised regular sunscreen use. Denies worrisome, changing, or new skin lesions.  7. Smoking associated screening (lung cancer screening, AAA screen 65-75, UA)- never smoker 8. STD screening - only active with wife  Status of chronic or acute concerns   #Social update- wife is struggling with exercise with chemo issues- still remaining active- incurable cancer- Working with [facility] when doctor transferred  #Low testosterone/testicular hypofunction S: Medication: Testim 5 g daily in the past- off due to psa elevation  -Significant issues with insurance in the past-did receive letter approved through 2039 -less energy off meds. Libido ok so far. 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- he is actually taking 10 mg daily (half of '20mg'$ ) - he will see if can quarter to see if that works still- if works can send in 10 mg and cut those instead (likely easier to havlve 10s then quarter 20s) -discussed usually 5 mg dose for daily use   #hypertension S: medication: none as diet and exercise controlled -Should avoid decongestants and Afrin (uses sparingly) A/P: stable without meds- monitor   #hyperlipidemia S: Medication:None and strongly against statin use for himself Lab Results  Component Value Date   CHOL 223 (H) 03/01/2021   HDL 51 03/01/2021   LDLCALC 139 (H) 03/01/2021   LDLDIRECT 135.7 12/27/2009   TRIG 194 (H) 03/01/2021   CHOLHDL 4.4 03/01/2021  A/P: 10 year ascvd risk only 5.6 %- no clear MI history- there was question of issue in dad but he was 5 but  smoker and never saw doctor- he prefers 7.5% cutoff to consider ct cardiac issues   # Hyperglycemia/insulin resistance/prediabetes-fasting sugars high in the past but A1c peak of 5.6  S:  Medication: none Exercise and diet- excellent A/P: wants to hold off on repeat a1c  #Essential tremor-intermittent issues.  Worsens with caffeine   #Thyroid cyst-history of left thyroid lobectomy due to cyst enlargement-thankfully no malignancy and has not required replacement-check TSH at least annually- done in February    Recommended follow up: Return in about 1 year (around 07/27/2022) for physical or sooner if needed.Schedule b4 you leave. Future Appointments  Date Time Provider [place]  08/06/2021  3:00 PM GI-315 MR 3 GI-315MRI [facility] [facility]   Lab/Order associations:already had labs in february   ICD-10-CM   1. Preventative health care  Z00.00     2. Essential hypertension  I10     3. Hyperlipidemia, unspecified hyperlipidemia type  E78.5     4. Hyperglycemia  R73.9       No orders of the defined types were placed in this encounter.   Return precautions advised.  Garret Reddish, MD

## 2021-07-26 NOTE — Patient Instructions (Addendum)
No changes today other than trying 5 mg tadalafil  daily (or 10 mg every other day) to see if that is still effective- we will follow along about the MRI  Recommended follow up: Return in about 1 year (around 07/27/2022) for physical or sooner if needed.Schedule b4 you leave.

## 2021-08-06 ENCOUNTER — Ambulatory Visit
Admission: RE | Admit: 2021-08-06 | Discharge: 2021-08-06 | Disposition: A | Discharge status: Home / Self Care | Payer: BC Managed Care – PPO | Source: Ambulatory Visit | Attending: Urology | Admitting: Urology

## 2021-08-06 DIAGNOSIS — R972 Elevated prostate specific antigen [PSA]: Secondary | ICD-10-CM

## 2021-08-06 MED ORDER — GADOBENATE DIMEGLUMINE 529 MG/ML IV SOLN
18.0000 mL | Freq: Once | INTRAVENOUS | Status: AC | PRN
  Administered 2021-08-06: 18 mL via INTRAVENOUS

## 2021-09-17 DIAGNOSIS — L738 Other specified follicular disorders: Secondary | ICD-10-CM | POA: Diagnosis not present

## 2021-09-17 DIAGNOSIS — D1801 Hemangioma of skin and subcutaneous tissue: Secondary | ICD-10-CM | POA: Diagnosis not present

## 2021-09-17 DIAGNOSIS — L814 Other melanin hyperpigmentation: Secondary | ICD-10-CM | POA: Diagnosis not present

## 2021-09-17 DIAGNOSIS — Z85820 Personal history of malignant melanoma of skin: Secondary | ICD-10-CM | POA: Diagnosis not present

## 2021-10-14 ENCOUNTER — Encounter: Payer: Self-pay | Admitting: *Deleted

## 2021-11-15 ENCOUNTER — Encounter: Payer: Self-pay | Admitting: Internal Medicine

## 2021-11-28 ENCOUNTER — Encounter: Payer: Self-pay | Admitting: Internal Medicine

## 2021-12-02 ENCOUNTER — Encounter: Payer: Self-pay | Admitting: *Deleted

## 2021-12-02 ENCOUNTER — Telehealth: Payer: Self-pay | Admitting: *Deleted

## 2021-12-02 NOTE — Patient Instructions (Signed)
Visit Information  Thank you for taking time to visit with me today. Please don't hesitate to contact me if I can be of assistance to you.   Following are the goals we discussed today:   Goals Addressed               This Visit's Progress     COMPLETED: No needs (pt-stated)        Care Coordination Interventions: Reviewed medications with patient and discussed adherence with all prescribed medications with no needed refills Reviewed scheduled/upcoming provider appointments including pending appointments with sufficient transportation Screening for signs and symptoms of depression related to chronic disease state  Assessed social determinant of health barriers          Please call the care guide team at [phone] if you need to cancel or reschedule your appointment.   If you are experiencing a Mental Health or [place] or need someone to talk to, please call the Suicide and Crisis Lifeline: 988  Patient verbalizes understanding of instructions and care plan provided today and agrees to view in [place]. Active MyChart status and patient understanding of how to access instructions and care plan via MyChart confirmed with patient.     No further follow up required: No needs  Raina Mina, RN Care Management Coordinator [place] Office [phone]

## 2021-12-02 NOTE — Patient Outreach (Signed)
  Care Coordination   Initial Visit Note   12/02/2021 Name: Joshua Norris MRN: [id number] DOB: 18-Nov-1965  Joshua Norris is a 56 y.o. year old male who sees Yong Channel, Brayton Mars, MD for primary care. I spoke with  [name] by phone today.  What matters to the patients health and wellness today?  No needs    Goals Addressed               This Visit's Progress     COMPLETED: No needs (pt-stated)        Care Coordination Interventions: Reviewed medications with patient and discussed adherence with all prescribed medications with no needed refills Reviewed scheduled/upcoming provider appointments including pending appointments with sufficient transportation Screening for signs and symptoms of depression related to chronic disease state  Assessed social determinant of health barriers          SDOH assessments and interventions completed:  Yes  SDOH Interventions Today    Flowsheet Row Most Recent Value  SDOH Interventions   Food Insecurity Interventions Intervention Not Indicated  Housing Interventions Intervention Not Indicated  Transportation Interventions Intervention Not Indicated  Utilities Interventions Intervention Not Indicated        Care Coordination Interventions Activated:  Yes  Care Coordination Interventions:  Yes, provided   Follow up plan: No further intervention required.   Encounter Outcome:  Pt. Visit Completed   Raina Mina, RN Care Management Coordinator [place] Office [phone]

## 2021-12-20 DIAGNOSIS — R972 Elevated prostate specific antigen [PSA]: Secondary | ICD-10-CM | POA: Diagnosis not present

## 2021-12-27 DIAGNOSIS — E349 Endocrine disorder, unspecified: Secondary | ICD-10-CM | POA: Diagnosis not present

## 2021-12-27 DIAGNOSIS — N401 Enlarged prostate with lower urinary tract symptoms: Secondary | ICD-10-CM | POA: Diagnosis not present

## 2021-12-27 DIAGNOSIS — R972 Elevated prostate specific antigen [PSA]: Secondary | ICD-10-CM | POA: Diagnosis not present

## 2021-12-27 DIAGNOSIS — R350 Frequency of micturition: Secondary | ICD-10-CM | POA: Diagnosis not present

## 2022-01-02 ENCOUNTER — Encounter: Payer: Self-pay | Admitting: *Deleted

## 2022-01-10 ENCOUNTER — Ambulatory Visit (AMBULATORY_SURGERY_CENTER): Payer: BC Managed Care – PPO | Admitting: *Deleted

## 2022-01-10 VITALS — Ht 70.0 in | Wt 185.0 lb

## 2022-01-10 DIAGNOSIS — Z800 Family history of malignant neoplasm of digestive organs: Secondary | ICD-10-CM

## 2022-01-10 DIAGNOSIS — Z86010 Personal history of colonic polyps: Secondary | ICD-10-CM

## 2022-01-10 MED ORDER — NA SULFATE-K SULFATE-MG SULF 17.5-3.13-1.6 GM/177ML PO SOLN: 1.0000 | Freq: Once | ORAL | 0 refills | Status: AC

## 2022-01-10 NOTE — Progress Notes (Signed)
Pt's previsit is done over the phone and all paperwork (prep instructions) sent to patient.  Pt's name and DOB verified at the beginning of the previsit.  Pt denies any difficulty with ambulating.  No egg or soy allergy known to patient  Pt has hx of PONV Patient denies ever being told they had issues or difficulty with intubation  No FH of Malignant Hyperthermia Pt is not on diet pills Pt is not on  home 02  Pt is not on blood thinners  Pt denies issues with constipation  Pt is not on dialysis Pt denies any upcoming cardiac testing Pt encouraged to use to use Singlecare or Goodrx to reduce cost   Patient's chart reviewed by Osvaldo Angst CNRA prior to previsit and patient appropriate for the [facility].  Previsit completed and red dot placed by patient's name on their procedure day (on provider's schedule).

## 2022-02-06 ENCOUNTER — Encounter: Payer: Self-pay | Admitting: Internal Medicine

## 2022-02-07 ENCOUNTER — Encounter: Payer: Self-pay | Admitting: Internal Medicine

## 2022-02-07 ENCOUNTER — Ambulatory Visit (AMBULATORY_SURGERY_CENTER): Payer: BC Managed Care – PPO | Admitting: Internal Medicine

## 2022-02-07 VITALS — BP 101/56 | HR 45 | Temp 97.3°F | Resp 14 | Ht 70.0 in | Wt 185.0 lb

## 2022-02-07 DIAGNOSIS — Z09 Encounter for follow-up examination after completed treatment for conditions other than malignant neoplasm: Secondary | ICD-10-CM

## 2022-02-07 DIAGNOSIS — Z800 Family history of malignant neoplasm of digestive organs: Secondary | ICD-10-CM

## 2022-02-07 DIAGNOSIS — Z86010 Personal history of colonic polyps: Secondary | ICD-10-CM | POA: Diagnosis not present

## 2022-02-07 DIAGNOSIS — Z1211 Encounter for screening for malignant neoplasm of colon: Secondary | ICD-10-CM | POA: Diagnosis not present

## 2022-02-07 MED ORDER — SODIUM CHLORIDE 0.9 % IV SOLN: 500.0000 mL | INTRAVENOUS | Status: DC

## 2022-02-07 NOTE — Progress Notes (Signed)
Sedate, gd SR, tolerated procedure well, VSS, report to RN 

## 2022-02-07 NOTE — Progress Notes (Signed)
Pt's states no medical or surgical changes since previsit or office visit. 

## 2022-02-07 NOTE — Patient Instructions (Signed)
Thank you for coming in to see [place] today! Resume your diet and medications today. Return to regular daily activities tomorrow. Recommend next surveillance colonoscopy in 5 years.   YOU HAD AN ENDOSCOPIC PROCEDURE TODAY AT [facility]:   Refer to the procedure report that was given to you for any specific questions about what was found during the examination.  If the procedure report does not answer your questions, please call your gastroenterologist to clarify.  If you requested that your care partner not be given the details of your procedure findings, then the procedure report has been included in a sealed envelope for you to review at your convenience later.  YOU SHOULD EXPECT: Some feelings of bloating in the abdomen. Passage of more gas than usual.  Walking can help get rid of the air that was put into your GI tract during the procedure and reduce the bloating. If you had a lower endoscopy (such as a colonoscopy or flexible sigmoidoscopy) you may notice spotting of blood in your stool or on the toilet paper. If you underwent a bowel prep for your procedure, you may not have a normal bowel movement for a few days.  Please Note:  You might notice some irritation and congestion in your nose or some drainage.  This is from the oxygen used during your procedure.  There is no need for concern and it should clear up in a day or so.  SYMPTOMS TO REPORT IMMEDIATELY:  Following lower endoscopy (colonoscopy or flexible sigmoidoscopy):  Excessive amounts of blood in the stool  Significant tenderness or worsening of abdominal pains  Swelling of the abdomen that is new, acute  Fever of 100F or higher    For urgent or emergent issues, a gastroenterologist can be reached at any hour by calling [phone]. Do not use MyChart messaging for urgent concerns.    DIET:  We do recommend a small meal at first, but then you may proceed to your regular diet.  Drink plenty of fluids but you  should avoid alcoholic beverages for 24 hours.  ACTIVITY:  You should plan to take it easy for the rest of today and you should NOT DRIVE or use heavy machinery until tomorrow (because of the sedation medicines used during the test).    FOLLOW UP: Our staff will call the number listed on your records the next business day following your procedure.  We will call around 7:15- 8:00 am to check on you and address any questions or concerns that you may have regarding the information given to you following your procedure. If we do not reach you, we will leave a message.     If any biopsies were taken you will be contacted by phone or by letter within the next 1-3 weeks.  Please call us at [phone] if you have not heard about the biopsies in 3 weeks.    SIGNATURES/CONFIDENTIALITY: You and/or your care partner have signed paperwork which will be entered into your electronic medical record.  These signatures attest to the fact that that the information above on your After Visit Summary has been reviewed and is understood.  Full responsibility of the confidentiality of this discharge information lies with you and/or your care-partner.

## 2022-02-07 NOTE — Op Note (Signed)
[facility] Patient Name: Joshua Norris Procedure Date: 02/07/2022 10:04 AM MRN: [id number] Endoscopist: Docia Chuck. Henrene Pastor , MD, [id number] Age: 57 Referring MD:  Date of Birth: 1965-12-01 Gender: Male Account #: [id number] Procedure:                Colonoscopy Indications:              High risk colon cancer surveillance: Personal                            history of sessile serrated colon polyp (less than                            10 mm in size) with no dysplasia. Also, father with                            colon cancer in his 49s. Previous examinations                            2007, 2013, 2018 Medicines:                Monitored Anesthesia Care Procedure:                Pre-Anesthesia Assessment:                           - Prior to the procedure, a History and Physical                            was performed, and patient medications and                            allergies were reviewed. The patient's tolerance of                            previous anesthesia was also reviewed. The risks                            and benefits of the procedure and the sedation                            options and risks were discussed with the patient.                            All questions were answered, and informed consent                            was obtained. Prior Anticoagulants: The patient has                            taken no anticoagulant or antiplatelet agents. ASA                            Grade Assessment: II - A patient with mild systemic  disease. After reviewing the risks and benefits,                            the patient was deemed in satisfactory condition to                            undergo the procedure.                           After obtaining informed consent, the colonoscope                            was passed under direct vision. Throughout the                            procedure, the patient's blood pressure, pulse,  and                            oxygen saturations were monitored continuously. The                            CF [id number] #[id number] was introduced through the anus                            and advanced to the the cecum, identified by                            appendiceal orifice and ileocecal valve. The                            ileocecal valve, appendiceal orifice, and rectum                            were photographed. The quality of the bowel                            preparation was excellent. The colonoscopy was                            performed without difficulty. The patient tolerated                            the procedure well. The bowel preparation used was                            SUPREP via split dose instruction. Scope In: 10:12:42 AM Scope Out: 10:25:42 AM Scope Withdrawal Time: 0 hours 10 minutes 31 seconds  Total Procedure Duration: 0 hours 13 minutes 0 seconds  Findings:                 A few small-mouthed diverticula were found in the                            sigmoid colon.  The exam was otherwise without abnormality on                            direct and retroflexion views. Complications:            No immediate complications. Estimated blood loss:                            None. Estimated Blood Loss:     Estimated blood loss: none. Impression:               - Diverticulosis in the sigmoid colon.                           - The examination was otherwise normal on direct                            and retroflexion views.                           - No specimens collected. Recommendation:           - Repeat colonoscopy in 5 years for surveillance                            (family history).                           - Patient has a contact number available for                            emergencies. The signs and symptoms of potential                            delayed complications were discussed with the                             patient. Return to normal activities tomorrow.                            Written discharge instructions were provided to the                            patient.                           - Resume previous diet.                           - Continue present medications. Docia Chuck. Henrene Pastor, MD 02/07/2022 10:31:42 AM This report has been signed electronically.

## 2022-02-07 NOTE — Progress Notes (Signed)
HISTORY OF PRESENT ILLNESS:  Joshua Norris is a 57 y.o. male with a personal history of sessile serrated polyp and family history of colon cancer in his father less than age 67.  Now for surveillance colonoscopy.  No complaints  REVIEW OF SYSTEMS:  All non-GI ROS negative except for  Past Medical History:  Diagnosis Date   Allergy    Arthritis    Joints   Cancer ([place])    pre cancer- skin, 2018- melanoma   Complication of anesthesia    n/v   Family history of adverse reaction to anesthesia    Mother- N/V   Lipoma 10/27/2008   Excised from behind R arm     PONV (postoperative nausea and vomiting)    Seasonal allergies    Thyroid disease    benign cyst- removed half of thyroid 2022    Past Surgical History:  Procedure Laterality Date   COLONOSCOPY     COLONOSCOPY     COLONOSCOPY W/ POLYPECTOMY     age 29   HIP SURGERY Right 2007   right; torn labrum   LUMBAR FUSION     L4-L5 late 2018   ROTATOR CUFF REPAIR Right    avulsed infraspinatus and supraspinatus   THYROIDECTOMY Left 10/15/2020   Procedure: LEFT THYROID LOBECTOMY;  Surgeon: Melida Quitter, MD;  Location: [facility];  Service: ENT;  Laterality: Left;    Social History [name]  reports that he has never smoked. He quit smokeless tobacco use about 14 years ago. He reports current alcohol use of about 3.0 standard drinks of alcohol per week. He reports that he does not use drugs.  family history includes Colon cancer (age of onset: 34) in his father; Crohn's disease in his brother; Emphysema in his mother; Hypertension in his mother; Melanoma (age of onset: 44) in his father; Pancreatic cancer (age of onset: 89) in his father; Prostate cancer (age of onset: 24) in his brother; Prostate cancer (age of onset: 89) in his father; Psoriasis in his mother; Rheum arthritis in his maternal grandmother; Sleep apnea in his mother; Tuberculosis in his brother.  Allergies  Allergen Reactions   Percocet  [Oxycodone-Acetaminophen] Nausea And Vomiting    Unsure if anesthesia or Percocet   Tramadol Nausea And Vomiting   Dilaudid [Hydromorphone Hcl] Nausea And Vomiting   Latex Other (See Comments)    Canker sores from dental procedures from gloves   Other Nausea And Vomiting    Severe nausea /vomiting from anesthesia or dilaudid.  Not sure which one caused it.        PHYSICAL EXAMINATION: Vital signs: BP 132/86   Pulse (!) 49   Temp (!) 97.3 F (36.3 C)   Ht '5\' 10"'$  (1.778 m)   Wt 185 lb (83.9 kg)   SpO2 98%   BMI 26.54 kg/m  General: Well-developed, well-nourished, no acute distress HEENT: Sclerae are anicteric, conjunctiva pink. Oral mucosa intact Lungs: Clear Heart: Regular Abdomen: soft, nontender, nondistended, no obvious ascites, no peritoneal signs, normal bowel sounds. No organomegaly. Extremities: No edema Psychiatric: alert and oriented x3. Cooperative     ASSESSMENT:  Personal history of sessile serrated polyp and family history of colon cancer   PLAN: Surveillance colonoscopy

## 2022-02-10 ENCOUNTER — Telehealth: Payer: Self-pay | Admitting: *Deleted

## 2022-02-10 NOTE — Telephone Encounter (Signed)
  Follow up Call-    Row Labels 02/07/2022    9:33 AM  Call back number   Section Header. No data exists in this row.   Post procedure Call Back phone  #   [phone]  Permission to leave phone message   Yes     Patient questions:  Do you have a fever, pain , or abdominal swelling? No. Pain Score  0 *  Have you tolerated food without any problems? Yes.    Have you been able to return to your normal activities? Yes.    Do you have any questions about your discharge instructions: Diet   No. Medications  No. Follow up visit  No.  Do you have questions or concerns about your Care? No.  Actions: * If pain score is 4 or above: No action needed, pain <4.

## 2022-03-24 DIAGNOSIS — R972 Elevated prostate specific antigen [PSA]: Secondary | ICD-10-CM | POA: Diagnosis not present

## 2022-03-25 DIAGNOSIS — L570 Actinic keratosis: Secondary | ICD-10-CM | POA: Diagnosis not present

## 2022-03-25 DIAGNOSIS — Z85820 Personal history of malignant melanoma of skin: Secondary | ICD-10-CM | POA: Diagnosis not present

## 2022-03-25 DIAGNOSIS — L821 Other seborrheic keratosis: Secondary | ICD-10-CM | POA: Diagnosis not present

## 2022-03-25 DIAGNOSIS — D225 Melanocytic nevi of trunk: Secondary | ICD-10-CM | POA: Diagnosis not present

## 2022-03-25 DIAGNOSIS — L812 Freckles: Secondary | ICD-10-CM | POA: Diagnosis not present

## 2022-03-28 DIAGNOSIS — N401 Enlarged prostate with lower urinary tract symptoms: Secondary | ICD-10-CM | POA: Diagnosis not present

## 2022-03-28 DIAGNOSIS — R350 Frequency of micturition: Secondary | ICD-10-CM | POA: Diagnosis not present

## 2022-04-04 DIAGNOSIS — R350 Frequency of micturition: Secondary | ICD-10-CM | POA: Diagnosis not present

## 2022-04-04 DIAGNOSIS — R972 Elevated prostate specific antigen [PSA]: Secondary | ICD-10-CM | POA: Diagnosis not present

## 2022-04-04 DIAGNOSIS — E349 Endocrine disorder, unspecified: Secondary | ICD-10-CM | POA: Diagnosis not present

## 2022-04-04 DIAGNOSIS — N401 Enlarged prostate with lower urinary tract symptoms: Secondary | ICD-10-CM | POA: Diagnosis not present

## 2022-04-11 DIAGNOSIS — E349 Endocrine disorder, unspecified: Secondary | ICD-10-CM | POA: Diagnosis not present

## 2022-06-07 ENCOUNTER — Other Ambulatory Visit: Payer: Self-pay | Admitting: Family Medicine

## 2022-07-25 DIAGNOSIS — Z0100 Encounter for examination of eyes and vision without abnormal findings: Secondary | ICD-10-CM | POA: Diagnosis not present

## 2022-07-29 DIAGNOSIS — M25551 Pain in right hip: Secondary | ICD-10-CM | POA: Diagnosis not present

## 2022-08-01 ENCOUNTER — Encounter: Payer: BC Managed Care – PPO | Admitting: Family Medicine

## 2022-08-01 DIAGNOSIS — R350 Frequency of micturition: Secondary | ICD-10-CM | POA: Diagnosis not present

## 2022-08-01 DIAGNOSIS — N401 Enlarged prostate with lower urinary tract symptoms: Secondary | ICD-10-CM | POA: Diagnosis not present

## 2022-08-01 DIAGNOSIS — E291 Testicular hypofunction: Secondary | ICD-10-CM | POA: Diagnosis not present

## 2022-08-07 DIAGNOSIS — M50321 Other cervical disc degeneration at C4-C5 level: Secondary | ICD-10-CM | POA: Diagnosis not present

## 2022-08-07 DIAGNOSIS — M47812 Spondylosis without myelopathy or radiculopathy, cervical region: Secondary | ICD-10-CM | POA: Diagnosis not present

## 2022-08-07 DIAGNOSIS — M542 Cervicalgia: Secondary | ICD-10-CM | POA: Diagnosis not present

## 2022-08-07 DIAGNOSIS — M4802 Spinal stenosis, cervical region: Secondary | ICD-10-CM | POA: Diagnosis not present

## 2022-08-07 DIAGNOSIS — M5021 Other cervical disc displacement,  high cervical region: Secondary | ICD-10-CM | POA: Diagnosis not present

## 2022-08-14 DIAGNOSIS — M542 Cervicalgia: Secondary | ICD-10-CM | POA: Diagnosis not present

## 2022-08-14 DIAGNOSIS — Z6826 Body mass index (BMI) 26.0-26.9, adult: Secondary | ICD-10-CM | POA: Diagnosis not present

## 2022-08-15 DIAGNOSIS — M47812 Spondylosis without myelopathy or radiculopathy, cervical region: Secondary | ICD-10-CM | POA: Diagnosis not present

## 2022-08-19 DIAGNOSIS — M47812 Spondylosis without myelopathy or radiculopathy, cervical region: Secondary | ICD-10-CM | POA: Diagnosis not present

## 2022-08-20 ENCOUNTER — Encounter (INDEPENDENT_AMBULATORY_CARE_PROVIDER_SITE_OTHER): Payer: Self-pay

## 2022-09-05 ENCOUNTER — Encounter: Payer: Self-pay | Admitting: Family Medicine

## 2022-09-05 ENCOUNTER — Ambulatory Visit (INDEPENDENT_AMBULATORY_CARE_PROVIDER_SITE_OTHER): Payer: BC Managed Care – PPO | Admitting: Family Medicine

## 2022-09-05 VITALS — BP 132/80 | HR 63 | Temp 98.7°F | Ht 70.0 in | Wt 188.4 lb

## 2022-09-05 DIAGNOSIS — I10 Essential (primary) hypertension: Secondary | ICD-10-CM | POA: Diagnosis not present

## 2022-09-05 DIAGNOSIS — Z0000 Encounter for general adult medical examination without abnormal findings: Secondary | ICD-10-CM

## 2022-09-05 DIAGNOSIS — M47812 Spondylosis without myelopathy or radiculopathy, cervical region: Secondary | ICD-10-CM | POA: Diagnosis not present

## 2022-09-05 DIAGNOSIS — R739 Hyperglycemia, unspecified: Secondary | ICD-10-CM

## 2022-09-05 DIAGNOSIS — Z125 Encounter for screening for malignant neoplasm of prostate: Secondary | ICD-10-CM

## 2022-09-05 DIAGNOSIS — M542 Cervicalgia: Secondary | ICD-10-CM | POA: Diagnosis not present

## 2022-09-05 DIAGNOSIS — E785 Hyperlipidemia, unspecified: Secondary | ICD-10-CM | POA: Diagnosis not present

## 2022-09-05 DIAGNOSIS — Z131 Encounter for screening for diabetes mellitus: Secondary | ICD-10-CM

## 2022-09-05 NOTE — Progress Notes (Signed)
Phone: [phone]   Subjective:  Patient presents today for their annual physical. Chief complaint-noted.   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   Complication of anesthesia    n/v   Family history of adverse reaction to anesthesia    Mother- N/V   Lipoma 10/27/2008   Excised from behind R arm     PONV (postoperative nausea and vomiting)    Seasonal allergies    Thyroid disease    benign cyst- removed half of thyroid Sep 24, 2020   Patient Active Problem List   Diagnosis Date Noted   S/P lumbar spinal fusion 07/24/2016    Priority: High   History of melanoma- [place] derm at least every 6 months 03/19/2018    Priority: Medium    Hyperlipidemia 10/07/2016    Priority: Medium    Hyperglycemia 02/15/2010    Priority: Medium    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    Priority: Medium    Essential hypertension 07/27/2007    Priority: Medium    Family history of melanoma 03/16/2014    Priority: Low   History of colonic polyps 03/16/2014    Priority: Low   Osteoarthritis 03/16/2014    Priority: Low   Allergic rhinitis 07/27/2007    Priority: Low   Past Surgical History:  Procedure Laterality Date   COLONOSCOPY     COLONOSCOPY     COLONOSCOPY W/ POLYPECTOMY     age 1   HIP SURGERY Right 09-24-05   right; torn labrum   LUMBAR FUSION     L4-L5 late 09/24/2016   ROTATOR CUFF REPAIR Right    avulsed infraspinatus and supraspinatus   SPINE SURGERY  07/24/2016   L4-L5 PLIF   THYROIDECTOMY Left 10/15/2020   Procedure: LEFT THYROID LOBECTOMY;  Surgeon: Christia Reading, MD;  Location: [facility];  Service: ENT;  Laterality: Left;    Family History  Problem Relation Age of Onset    Hypertension Mother    Sleep apnea Mother        overweight   Emphysema Mother        smoker   Psoriasis Mother        arthritis   Arthritis Mother    COPD Mother    Varicose Veins Mother    Colon cancer Father 12       end stage. died age 21 in September 25, 2014.    Pancreatic cancer Father 47       asymptomatic at presentation   Prostate cancer Father 73   Melanoma Father 26   Cancer Father    Prostate cancer Brother 87       now metastatic   Crohn's disease Brother    Tuberculosis Brother        inactive   Cancer Brother    Rheum arthritis Maternal Grandmother    Stomach cancer Neg Hx    Esophageal cancer Neg Hx    Rectal cancer Neg Hx     Medications- reviewed and updated Current Outpatient Medications  Medication Sig Dispense Refill   B Complex Vitamins (B COMPLEX-B12 PO) Take 1 tablet by mouth daily.      cholecalciferol (VITAMIN D) 1000 UNITS tablet  Take 1,000 Units by mouth daily.     glucosamine-chondroitin 500-400 MG tablet Take 2 tablets by mouth daily.      ibuprofen (ADVIL,MOTRIN) 200 MG tablet Take 800 mg by mouth 2 (two) times daily.     Misc Natural Products (BEE PROPOLIS PO) Take 1,000 mg by mouth 2 (two) times daily.     oxymetazoline (AFRIN) 0.05 % nasal spray Place 1 spray into both nostrils 2 (two) times daily as needed for congestion.     tadalafil (CIALIS) 20 MG tablet TAKE ONE TABLET BY MOUTH EVERY OTHER DAY 15 tablet 11   testosterone (ANDROGEL) 50 MG/5GM (1%) GEL PLACE 2.5 GRAMS ONTO THE SKIN DAILY.     No current facility-administered medications for this visit.    Allergies-reviewed and updated Allergies  Allergen Reactions   Percocet [Oxycodone-Acetaminophen] Nausea And Vomiting    Unsure if anesthesia or Percocet   Tramadol Nausea And Vomiting   Dilaudid [Hydromorphone Hcl] Nausea And Vomiting   Latex Other (See Comments)    Canker sores from dental procedures from gloves   Other Nausea And Vomiting    Severe nausea /vomiting from anesthesia or  dilaudid.  Not sure which one caused it.     Social History   Social History Narrative   Married. 1 son from 1st marriage 56 years old in 2016.       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 Oropharynx normal Neck: no thyromegaly CV: RRR no murmurs rubs or gallops Lungs: CTAB no crackles, wheeze, rhonchi Abdomen: soft/nontender/nondistended/normal bowel sounds. No rebound or guarding.  Ext: no edema Skin: warm, dry Neuro: grossly normal, moves all extremities, PERRLA   Assessment and Plan  57 y.o. male presenting for annual physical.  Health Maintenance counseling: 1. Anticipatory guidance: Patient counseled regarding regular dental exams -q6 months, eye exams -yearlyearly,  avoiding smoking and second hand smoke , limiting alcohol to 2 beverages per day -less than 2 a week most weeks, no illicit drugs .   2. Risk factor reduction:  Advised patient of need for regular exercise and diet rich and fruits and vegetables to reduce risk of heart attack and stroke.  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  Diet/weight management-very healthy/clean eater.  Wt Readings from Last 3 Encounters:  09/05/22 188 lb 6.4 oz (85.5 kg)  02/07/22 185 lb (83.9 kg)  01/10/22 185 lb (83.9 kg)  3. Immunizations/screenings/ancillary studies-declines flu and COVID shot  Immunization History  Administered Date(s) Administered   Influenza-Unspecified 10/04/2013, 11/28/2015   PFIZER(Purple Top)SARS-COV-2 Vaccination 04/14/2019, 05/05/2019, 12/27/2019   Td 07/27/2007   Tdap 03/18/2018  4. Prostate cancer screening- referred to urology last year-most recently seen in March and they noted PSA at 3.8 with  normal prostate MRI and normal ExoDx - see below Lab Results  Component Value Date   PSA 2.69 03/28/2021   PSA 3.03 03/01/2021   PSA 1.31 02/23/2020   5. Colon cancer screening - February 07 2022 with 5 year repeat  for family history- one polyp at 46 6. Skin cancer screening- q6  months with melanoma history. advised regular sunscreen use. Denies worrisome, changing, or new skin lesions.  7. Smoking associated screening (lung cancer screening, AAA screen 65-75, UA)- never smoker 8. STD screening - only active with wfie  Status of chronic or acute concerns   #wife  on [employer]- at [facility] now - doing well on Mounjaro- counterbalances side effects including weight gain  # Neck pain-working with neurosurgery-acute facet arthropathy C3-C4 on the left with left upper neck pain-plan for medial branch block- possible RFA. A few [facility] with [facility]- has been trying to rehab this for long period of time  #Hip pain- injection 3-4 weeks ago only lasted 2 days- may get hip replacement. Does hip mobility but worsens. Sept 5th with Dr. Charlann Boxer.  - can climb 2 flights of stairs without chest pain or shortness of breath - exercises 5 days a week and does cardiology on ellipitical- not running right now  #Low testosterone/testicular hypofunction S: Medication: back on testosterone through urology in 2024 after reassuring prostate workup. Started back early August 2024. Was having joint pain, fatigue off meds -Significant issues with insurance in the past-did receive letter approved through 2039 A/P:improving back on therapy and glad ok with urology -gets q6 months labs as well to monitor PSA, testosterone   #hypertension S: medication: none as diet and exercise controlled -Should avoid decongestants and Afrin  A/P: doing well off medications- continue to monitor    #hyperlipidemia S: Medication:None and strongly against statin use for himself. No family history heart disease.  The 10-year ASCVD risk  score (Arnett DK, et al., 2019) is: 7.1% Lab Results  Component Value Date   CHOL 223 (H) 03/01/2021   HDL 51 03/01/2021   LDLCALC 139 (H) 03/01/2021   LDLDIRECT 135.7 12/27/2009   TRIG 194 (H) 03/01/2021   CHOLHDL 4.4 03/01/2021  A/P: he is interested in possibly doing CT calcium scoring- update lipid panel but we discussed as cash pay even if under 7.5% risk he can still opt in.     # Hyperglycemia/insulin resistance/prediabetes-fasting sugars high in the past but A1c peak of 5.6- opts out today  Lab Results  Component Value Date   HGBA1C 5.6 02/23/2020   HGBA1C 5.5 02/15/2010   #Essential tremor-intermittent issues.  Worsens with caffeine- doing ok lately   #Thyroid cyst-history of left thyroid lobectomy due to cyst enlargement-thankfully no malignancy and has not required replacement-he opts out of TSH  Recommended follow up: Return in about 1 year (around 09/05/2023) for physical or sooner if needed.Schedule b4 you leave.  Lab/Order associations:NOT fasting   ICD-10-CM   1. Preventative health care  Z00.00     2. Essential hypertension  I10 Comprehensive metabolic panel    CBC with Differential/Platelet    Lipid panel    3. Hyperlipidemia, unspecified hyperlipidemia type  E78.5 Comprehensive metabolic panel    CBC with Differential/Platelet    Lipid panel    4. Hyperglycemia  R73.9     5. Screening for diabetes mellitus  Z13.1     6. Screening for prostate cancer  Z12.5       No orders of the defined types were placed in this encounter.   Return precautions advised.  Tana Conch, MD

## 2022-09-05 NOTE — Patient Instructions (Signed)
Ok to accept your son into my practice  Schedule a lab visit at the check out desk within 2 weeks. Return for future fasting labs meaning nothing but water after midnight please. Ok to take your medications with water.   Recommended follow up: Return in about 1 year (around 09/05/2023) for physical or sooner if needed.Schedule b4 you leave.

## 2022-09-09 DIAGNOSIS — M47812 Spondylosis without myelopathy or radiculopathy, cervical region: Secondary | ICD-10-CM | POA: Diagnosis not present

## 2022-09-16 ENCOUNTER — Other Ambulatory Visit (INDEPENDENT_AMBULATORY_CARE_PROVIDER_SITE_OTHER): Payer: BC Managed Care – PPO

## 2022-09-16 DIAGNOSIS — E785 Hyperlipidemia, unspecified: Secondary | ICD-10-CM | POA: Diagnosis not present

## 2022-09-16 DIAGNOSIS — I10 Essential (primary) hypertension: Secondary | ICD-10-CM

## 2022-09-16 LAB — CBC WITH DIFFERENTIAL/PLATELET
Basophils Absolute: 0 10*3/uL (ref 0.0–0.1)
Basophils Relative: 0.4 % (ref 0.0–3.0)
Eosinophils Absolute: 0.2 10*3/uL (ref 0.0–0.7)
Eosinophils Relative: 3.3 % (ref 0.0–5.0)
HCT: 45.6 % (ref 39.0–52.0)
Hemoglobin: 15.1 g/dL (ref 13.0–17.0)
Lymphocytes Relative: 19.8 % (ref 12.0–46.0)
Lymphs Abs: 1 10*3/uL (ref 0.7–4.0)
MCHC: 33.1 g/dL (ref 30.0–36.0)
MCV: 87 fl (ref 78.0–100.0)
Monocytes Absolute: 0.4 10*3/uL (ref 0.1–1.0)
Monocytes Relative: 8.1 % (ref 3.0–12.0)
Neutro Abs: 3.5 10*3/uL (ref 1.4–7.7)
Neutrophils Relative %: 68.4 % (ref 43.0–77.0)
Platelets: 267 10*3/uL (ref 150.0–400.0)
RBC: 5.24 Mil/uL (ref 4.22–5.81)
RDW: 13.3 % (ref 11.5–15.5)
WBC: 5.1 10*3/uL (ref 4.0–10.5)

## 2022-09-16 LAB — COMPREHENSIVE METABOLIC PANEL
ALT: 19 U/L (ref 0–53)
AST: 21 U/L (ref 0–37)
Albumin: 4.1 g/dL (ref 3.5–5.2)
Alkaline Phosphatase: 53 U/L (ref 39–117)
BUN: 17 mg/dL (ref 6–23)
CO2: 31 mEq/L (ref 19–32)
Calcium: 9.3 mg/dL (ref 8.4–10.5)
Chloride: 101 mEq/L (ref 96–112)
Creatinine, Ser: 0.99 mg/dL (ref 0.40–1.50)
GFR: 85.01 mL/min (ref >60.00)
Glucose, Bld: 114 mg/dL — ABNORMAL HIGH (ref 70–99)
Potassium: 4.3 mEq/L (ref 3.5–5.1)
Sodium: 138 mEq/L (ref 135–145)
Total Bilirubin: 0.6 mg/dL (ref 0.2–1.2)
Total Protein: 6.7 g/dL (ref 6.0–8.3)

## 2022-09-16 LAB — LIPID PANEL
Cholesterol: 203 mg/dL — ABNORMAL HIGH (ref 0–200)
HDL: 42 mg/dL (ref >39.00)
LDL Cholesterol: 137 mg/dL — ABNORMAL HIGH (ref 0–99)
NonHDL: 160.83
Total CHOL/HDL Ratio: 5
Triglycerides: 117 mg/dL (ref 0.0–149.0)
VLDL: 23.4 mg/dL (ref 0.0–40.0)

## 2022-09-25 DIAGNOSIS — M25551 Pain in right hip: Secondary | ICD-10-CM | POA: Diagnosis not present

## 2022-09-30 DIAGNOSIS — D1801 Hemangioma of skin and subcutaneous tissue: Secondary | ICD-10-CM | POA: Diagnosis not present

## 2022-09-30 DIAGNOSIS — L812 Freckles: Secondary | ICD-10-CM | POA: Diagnosis not present

## 2022-09-30 DIAGNOSIS — D0362 Melanoma in situ of left upper limb, including shoulder: Secondary | ICD-10-CM | POA: Diagnosis not present

## 2022-09-30 DIAGNOSIS — M47812 Spondylosis without myelopathy or radiculopathy, cervical region: Secondary | ICD-10-CM | POA: Diagnosis not present

## 2022-09-30 DIAGNOSIS — Z85820 Personal history of malignant melanoma of skin: Secondary | ICD-10-CM | POA: Diagnosis not present

## 2022-09-30 DIAGNOSIS — L821 Other seborrheic keratosis: Secondary | ICD-10-CM | POA: Diagnosis not present

## 2022-10-07 DIAGNOSIS — N401 Enlarged prostate with lower urinary tract symptoms: Secondary | ICD-10-CM | POA: Diagnosis not present

## 2022-10-07 DIAGNOSIS — E291 Testicular hypofunction: Secondary | ICD-10-CM | POA: Diagnosis not present

## 2022-10-07 DIAGNOSIS — R350 Frequency of micturition: Secondary | ICD-10-CM | POA: Diagnosis not present

## 2022-10-15 DIAGNOSIS — R350 Frequency of micturition: Secondary | ICD-10-CM | POA: Diagnosis not present

## 2022-10-15 DIAGNOSIS — N401 Enlarged prostate with lower urinary tract symptoms: Secondary | ICD-10-CM | POA: Diagnosis not present

## 2022-10-15 DIAGNOSIS — E349 Endocrine disorder, unspecified: Secondary | ICD-10-CM | POA: Diagnosis not present

## 2022-10-15 DIAGNOSIS — R972 Elevated prostate specific antigen [PSA]: Secondary | ICD-10-CM | POA: Diagnosis not present

## 2022-10-17 ENCOUNTER — Other Ambulatory Visit: Payer: Self-pay | Admitting: Urology

## 2022-10-17 DIAGNOSIS — R972 Elevated prostate specific antigen [PSA]: Secondary | ICD-10-CM

## 2022-10-17 DIAGNOSIS — N401 Enlarged prostate with lower urinary tract symptoms: Secondary | ICD-10-CM

## 2022-10-17 DIAGNOSIS — R350 Frequency of micturition: Secondary | ICD-10-CM

## 2022-10-30 DIAGNOSIS — D0362 Melanoma in situ of left upper limb, including shoulder: Secondary | ICD-10-CM | POA: Diagnosis not present

## 2022-12-05 ENCOUNTER — Ambulatory Visit
Admission: RE | Admit: 2022-12-05 | Discharge: 2022-12-05 | Disposition: A | Discharge status: Home / Self Care | Payer: BC Managed Care – PPO | Source: Ambulatory Visit | Attending: Urology | Admitting: Urology

## 2022-12-05 DIAGNOSIS — N401 Enlarged prostate with lower urinary tract symptoms: Secondary | ICD-10-CM

## 2022-12-05 DIAGNOSIS — R350 Frequency of micturition: Secondary | ICD-10-CM

## 2022-12-05 DIAGNOSIS — R972 Elevated prostate specific antigen [PSA]: Secondary | ICD-10-CM

## 2022-12-05 MED ORDER — GADOPICLENOL 0.5 MMOL/ML IV SOLN
9.0000 mL | Freq: Once | INTRAVENOUS | Status: AC | PRN
  Administered 2022-12-05: 9 mL via INTRAVENOUS

## 2022-12-30 DIAGNOSIS — M47812 Spondylosis without myelopathy or radiculopathy, cervical region: Secondary | ICD-10-CM | POA: Diagnosis not present

## 2022-12-30 DIAGNOSIS — M5412 Radiculopathy, cervical region: Secondary | ICD-10-CM | POA: Diagnosis not present

## 2022-12-30 DIAGNOSIS — M542 Cervicalgia: Secondary | ICD-10-CM | POA: Diagnosis not present

## 2023-01-01 ENCOUNTER — Ambulatory Visit: Payer: BC Managed Care – PPO | Admitting: Family Medicine

## 2023-01-01 ENCOUNTER — Encounter: Payer: Self-pay | Admitting: Family Medicine

## 2023-01-01 VITALS — BP 120/64 | HR 55 | Temp 98.0°F | Ht 70.0 in | Wt 193.4 lb

## 2023-01-01 DIAGNOSIS — Z131 Encounter for screening for diabetes mellitus: Secondary | ICD-10-CM | POA: Diagnosis not present

## 2023-01-01 DIAGNOSIS — Z01818 Encounter for other preprocedural examination: Secondary | ICD-10-CM

## 2023-01-01 DIAGNOSIS — I10 Essential (primary) hypertension: Secondary | ICD-10-CM

## 2023-01-01 DIAGNOSIS — E663 Overweight: Secondary | ICD-10-CM

## 2023-01-01 LAB — COMPREHENSIVE METABOLIC PANEL
ALT: 17 U/L (ref 0–53)
AST: 20 U/L (ref 0–37)
Albumin: 4.1 g/dL (ref 3.5–5.2)
Alkaline Phosphatase: 42 U/L (ref 39–117)
BUN: 18 mg/dL (ref 6–23)
CO2: 28 meq/L (ref 19–32)
Calcium: 8.8 mg/dL (ref 8.4–10.5)
Chloride: 104 meq/L (ref 96–112)
Creatinine, Ser: 0.93 mg/dL (ref 0.40–1.50)
GFR: 91.44 mL/min (ref >60.00)
Glucose, Bld: 106 mg/dL — ABNORMAL HIGH (ref 70–99)
Potassium: 4.3 meq/L (ref 3.5–5.1)
Sodium: 137 meq/L (ref 135–145)
Total Bilirubin: 0.4 mg/dL (ref 0.2–1.2)
Total Protein: 6.7 g/dL (ref 6.0–8.3)

## 2023-01-01 LAB — CBC WITH DIFFERENTIAL/PLATELET
Basophils Absolute: 0.2 10*3/uL — ABNORMAL HIGH (ref 0.0–0.1)
Basophils Relative: 2.8 % (ref 0.0–3.0)
Eosinophils Absolute: 0.3 10*3/uL (ref 0.0–0.7)
Eosinophils Relative: 5.8 % — ABNORMAL HIGH (ref 0.0–5.0)
HCT: 43.1 % (ref 39.0–52.0)
Hemoglobin: 14.6 g/dL (ref 13.0–17.0)
Lymphocytes Relative: 17.7 % (ref 12.0–46.0)
Lymphs Abs: 1 10*3/uL (ref 0.7–4.0)
MCHC: 33.8 g/dL (ref 30.0–36.0)
MCV: 87.3 fL (ref 78.0–100.0)
Monocytes Absolute: 0.4 10*3/uL (ref 0.1–1.0)
Monocytes Relative: 7.6 % (ref 3.0–12.0)
Neutro Abs: 3.9 10*3/uL (ref 1.4–7.7)
Neutrophils Relative %: 66.1 % (ref 43.0–77.0)
Platelets: 274 10*3/uL (ref 150.0–400.0)
RBC: 4.94 Mil/uL (ref 4.22–5.81)
RDW: 13.2 % (ref 11.5–15.5)
WBC: 5.9 10*3/uL (ref 4.0–10.5)

## 2023-01-01 LAB — HEMOGLOBIN A1C: Hgb A1c MFr Bld: 5.9 % (ref 4.6–6.5)

## 2023-01-01 NOTE — Patient Instructions (Addendum)
I am happy to accept your mom as a transfer patient within [place]  Please stop by lab before you go If you have mychart- we will send your results within 3 business days of [place] receiving them.  If you do not have mychart- we will call you about results within 5 business days of [place] receiving them.  *please also note that you will see labs on mychart as soon as they post. I will later go in and write notes on them- will say "notes from Dr. Durene Cal"   Recommended follow up: Return for next already scheduled visit or sooner if needed.

## 2023-01-01 NOTE — Progress Notes (Signed)
Phone [phone] In person visit   Subjective:   Joshua Norris is a 57 y.o. year old very pleasant male patient who presents for/with See problem oriented charting Chief Complaint  Patient presents with   surgery clearance    Pt needing new set of labs for upcoming surgery.     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      cholecalciferol (VITAMIN D) 1000 UNITS tablet Take 1,000 Units by mouth daily.     glucosamine-chondroitin 500-400 MG tablet Take 2 tablets by mouth daily.      ibuprofen (ADVIL,MOTRIN) 200 MG tablet Take 800 mg by mouth 2 (two) times daily.     Misc Natural Products (BEE PROPOLIS PO) Take 1,000 mg by mouth 2 (two) times daily.     oxymetazoline (AFRIN) 0.05 % nasal spray Place 1 spray into both nostrils 2 (two) times daily as needed for congestion.     tadalafil (CIALIS) 20 MG tablet TAKE ONE TABLET BY MOUTH EVERY OTHER DAY 15 tablet 11   testosterone (ANDROGEL) 50 MG/5GM (1%) GEL PLACE 2.5 GRAMS ONTO THE SKIN DAILY.     No current facility-administered medications for this visit.     Objective:  BP 120/64   Pulse (!) 55   Temp 98 F (36.7 C)   Ht 5\' 10"  (1.778 m)   Wt 193 lb 6.4 oz  (87.7 kg)   SpO2 96%   BMI 27.75 kg/m  Gen: NAD, resting comfortably     Assessment and Plan   # Hip pain/right hip arthritis S: Ongoing issues and scheduled for hip replacement on the right side.will be with Dr. Charlann Boxer.  - still bale to climb 2 flights of stairs without chest pain or shortness of breath - still exercinsg 5 days a week including cardiology on elliptical. Working through the pain at present A/P: Right hip arthritis in need of total hip replacement-upcoming surgery with Dr. Mliss Fritz maximized for surgery but he needs updated labs per [facility] request-I already sent in clearance form but they require labs within 90 days  #hypertension S: medication: Remains controlled without medication BP Readings from Last 3 Encounters:  01/01/23 120/64  09/05/22 132/80  02/07/22 (!) 101/56  A/P: Well-controlled going into surgery-maintain diet/exercise as best as possible especially with upcoming surgery  #hyperlipidemia S: Medication: None and strongly prefers to remain off statin Lab Results  Component Value Date   CHOL 203 (H) 09/16/2022   HDL 42.00 09/16/2022   LDLCALC 137 (H) 09/16/2022   LDLDIRECT 135.7 12/27/2009   TRIG 117.0 09/16/2022   CHOLHDL 5 09/16/2022   A/P: Does have mildly  elevated lipids and we could consider CT calcium scoring-he is going to think this over and possibly reach out next year-this should certainly not hold off his surgery the  # Hyperglycemia/screen for diabetes-high fasting sugars in the past but A1c has been okay-screen again today going into surgery  Lab Results  Component Value Date   HGBA1C 5.6 02/23/2020   HGBA1C 5.5 02/15/2010   Recommended follow up: Return for next already scheduled visit or sooner if needed. Future Appointments  Date Time Provider Department Center  09/11/2023  3:00 PM Shelva Majestic, MD [facility]    Lab/Order associations:   ICD-10-CM   1. Pre-operative clearance  Z01.818     2. Screening for  diabetes mellitus  Z13.1 HgB A1c    3. Overweight  E66.3 HgB A1c    4. Essential hypertension  I10 Comprehensive metabolic panel    CBC with Differential/Platelet      No orders of the defined types were placed in this encounter.   Return precautions advised.  Tana Conch, MD

## 2023-01-15 DIAGNOSIS — M25751 Osteophyte, right hip: Secondary | ICD-10-CM | POA: Diagnosis not present

## 2023-01-15 DIAGNOSIS — M1611 Unilateral primary osteoarthritis, right hip: Secondary | ICD-10-CM | POA: Diagnosis not present

## 2023-01-15 DIAGNOSIS — M25451 Effusion, right hip: Secondary | ICD-10-CM | POA: Diagnosis not present

## 2023-01-15 DIAGNOSIS — M24851 Other specific joint derangements of right hip, not elsewhere classified: Secondary | ICD-10-CM | POA: Diagnosis not present

## 2023-01-15 DIAGNOSIS — M25851 Other specified joint disorders, right hip: Secondary | ICD-10-CM | POA: Diagnosis not present

## 2023-02-17 DIAGNOSIS — M5412 Radiculopathy, cervical region: Secondary | ICD-10-CM | POA: Diagnosis not present

## 2023-03-04 DIAGNOSIS — Z5189 Encounter for other specified aftercare: Secondary | ICD-10-CM | POA: Diagnosis not present

## 2023-03-26 IMAGING — CT CT NECK W/ CM
4 of 5 series · 14 of 33 positions shown, 16 images · IV contrast (APPLIED)
Comparison: Cervical spine MRI 09/29/2017

CLINICAL DATA: Patient noticed a large lump on the left side of his
neck 3 days ago

EXAM:
CT NECK WITH CONTRAST
TECHNIQUE: Multidetector CT imaging of the neck was performed using the
standard protocol following the bolus administration of intravenous
contrast.
CONTRAST:  60mL OMNIPAQUE IOHEXOL 350 MG/ML SOLN

[Series 3: ax bone · axial · 0.49mm/px · z∈[-503,-391]mm · 3 of 112 slices shown, 4 images]
[im 28/112  soft-tissue]
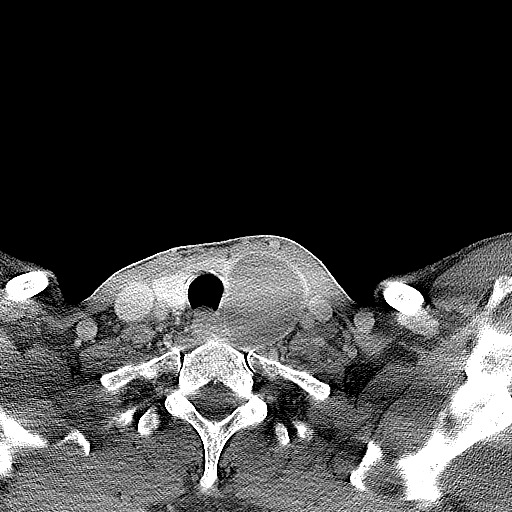
[im 28/112  bone]
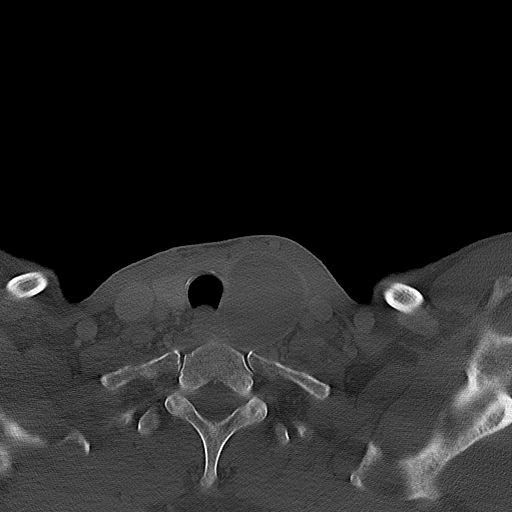
[im 56/112  bone]
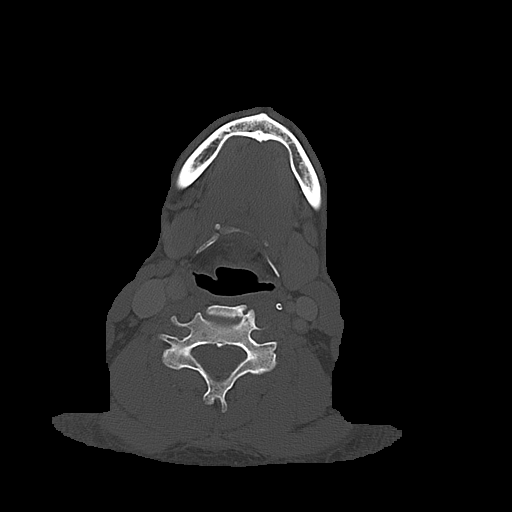
[im 84/112  bone]
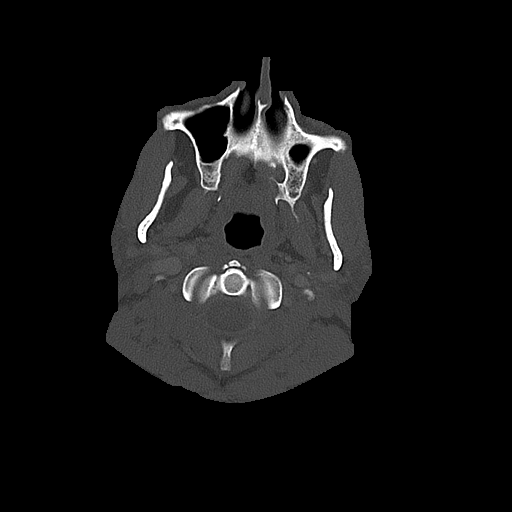

[Series 4: cor neck · coronal · 0.46mm/px · 3 of 140 slices shown]
[im 28/140  bone]
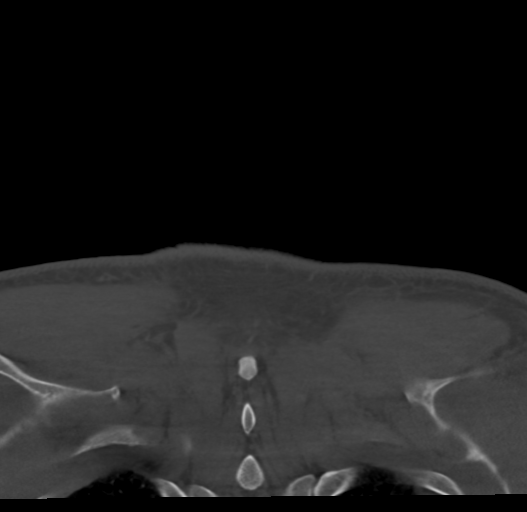
[im 56/140  bone]
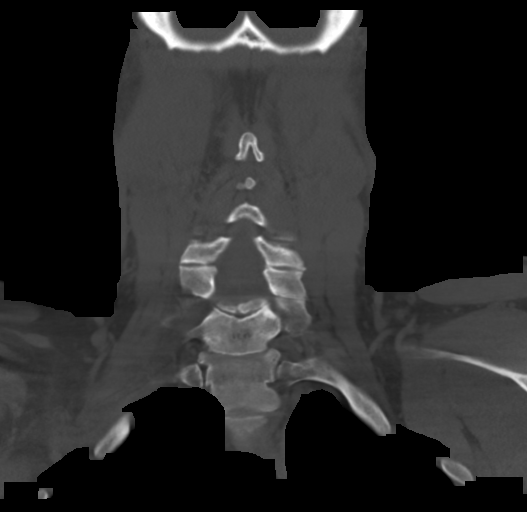
[im 84/140  bone]
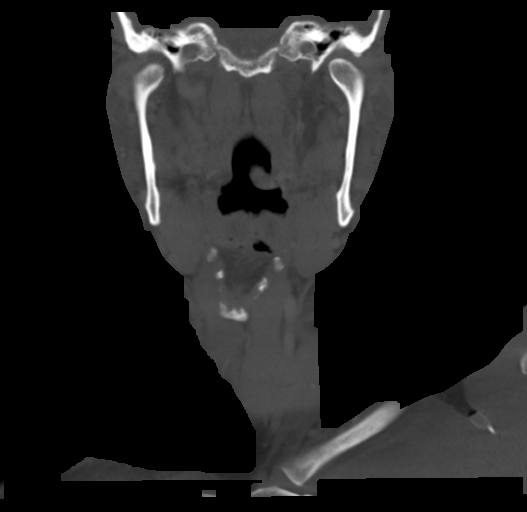

[Series 5: sag neck (person_name) · sagittal · 0.46mm/px · 5 of 122 slices shown, 6 images]
[im 41/122  bone]
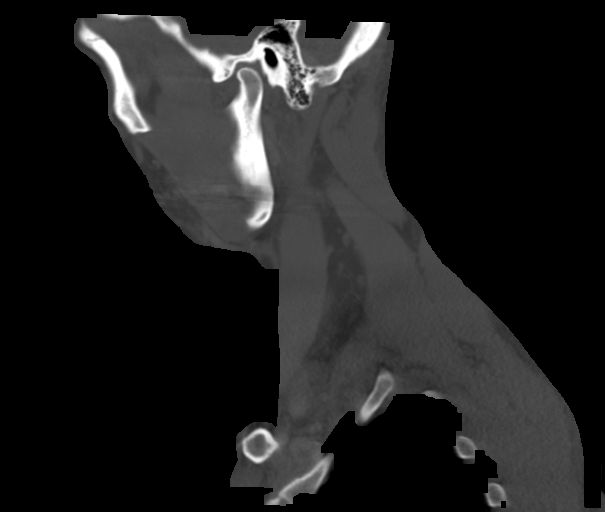
[im 51/122  bone]
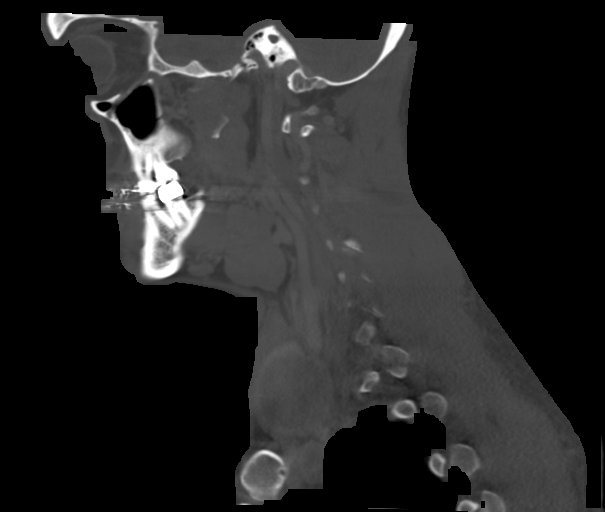
[im 61/122  soft-tissue]
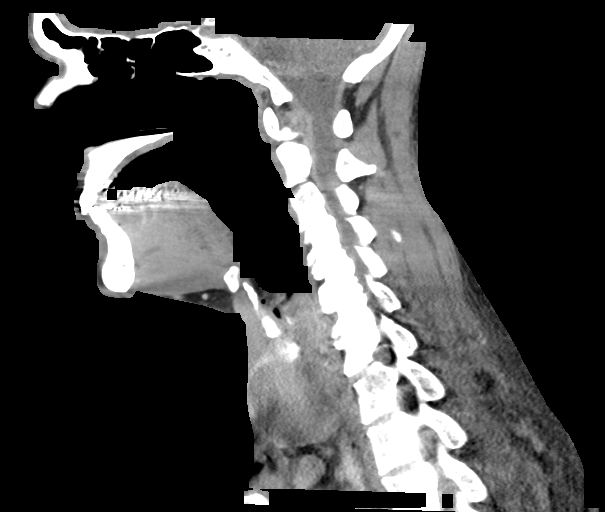
[im 61/122  bone]
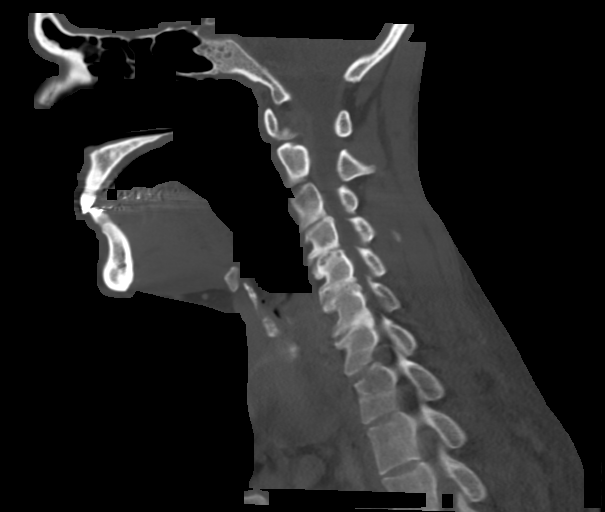
[im 71/122  bone]
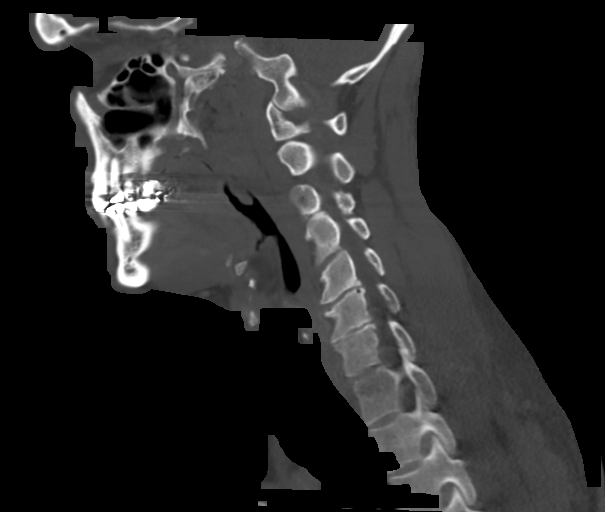
[im 81/122  bone]
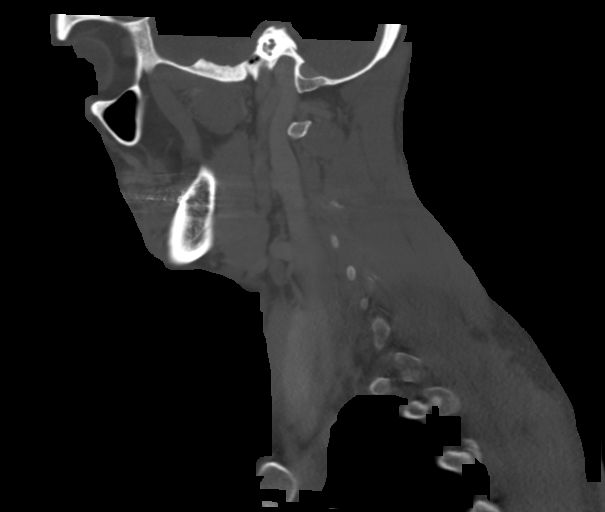

[Series 6: ax oropharynx · axial · 0.49mm/px · z∈[-501,-385]mm · 3 of 117 slices shown]
[im 30/117  bone]
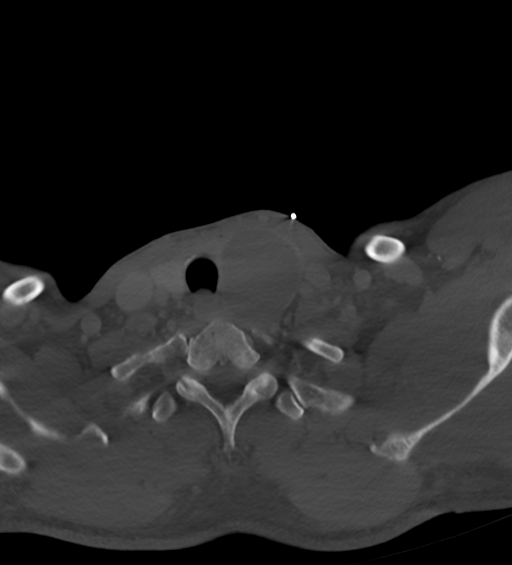
[im 59/117  bone]
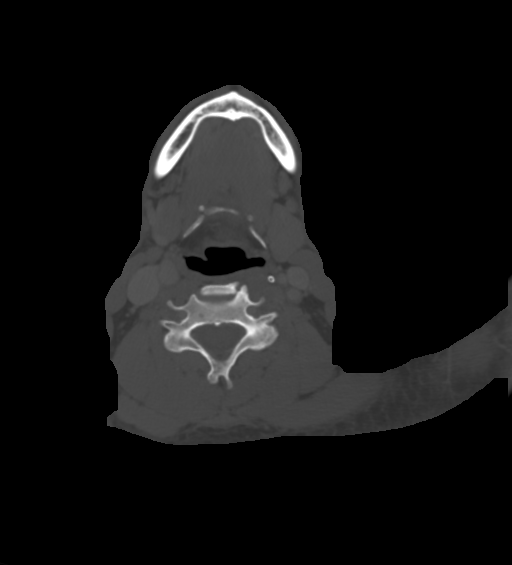
[im 88/117  bone]
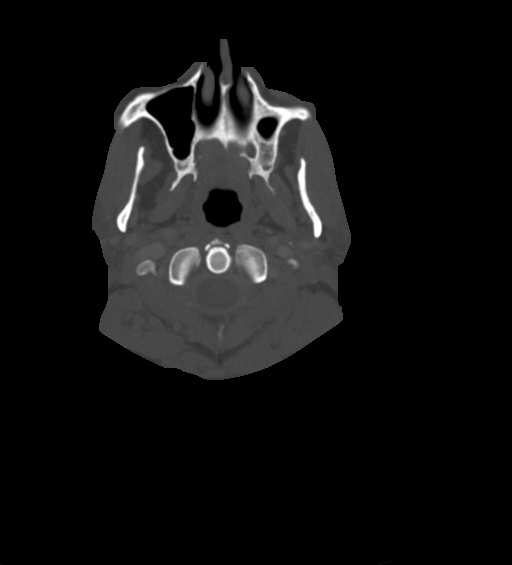

[14 of 33 positions shown; findings below may reference images not displayed]

FINDINGS: Pharynx and larynx: The nasal cavity, nasopharynx, oral cavity,
oropharynx, hypopharynx, and larynx are normal in appearance. No
mass or swelling is identified.

Salivary glands: The parotid and submandibular glands are
unremarkable.

Thyroid: There is a 4.5 cm x 3.9 cm by 4.4 cm hypodense left thyroid
nodule. There is suggestion of mural nodularity along the inferior
margin (295, 4-77). This nodule was likely present on the MR
cervical spine from 5409, though the area was incompletely imaged.
There is no significant narrowing of the adjacent airway. The right
thyroid lobe is unremarkable.

Lymph nodes: There is no cervical lymphadenopathy.

Vascular: Unremarkable.

Limited intracranial: The imaged portions of the intracranial
compartment are unremarkable.

Visualized orbits: The imaged globes and orbits are unremarkable.

Mastoids and visualized paranasal sinuses: The imaged paranasal
sinuses and mastoid air cells are clear.

Skeleton: There is trace anterolisthesis of C3 on C4. There is
multilevel degenerative change of the cervical spine, most advanced
at C5-C6 and C6-C7. There is no aggressive osseous lesion or acute
osseous abnormality.

Upper chest: The lung apices are clear.

Other: None.
IMPRESSION: 1. Large left thyroid nodule measuring up to 4.5 cm with possible
mural nodularity. This nodule was likely present on the MRI cervical
spine from 09/29/2017, though the area was incompletely imaged.
Recommend thyroid ultrasound. (Ref: [HOSPITAL]. [id number]
2. No cervical lymphadenopathy.

## 2023-03-31 DIAGNOSIS — D225 Melanocytic nevi of trunk: Secondary | ICD-10-CM | POA: Diagnosis not present

## 2023-03-31 DIAGNOSIS — L812 Freckles: Secondary | ICD-10-CM | POA: Diagnosis not present

## 2023-03-31 DIAGNOSIS — Z85820 Personal history of malignant melanoma of skin: Secondary | ICD-10-CM | POA: Diagnosis not present

## 2023-03-31 DIAGNOSIS — L821 Other seborrheic keratosis: Secondary | ICD-10-CM | POA: Diagnosis not present

## 2023-04-03 DIAGNOSIS — N401 Enlarged prostate with lower urinary tract symptoms: Secondary | ICD-10-CM | POA: Diagnosis not present

## 2023-04-03 DIAGNOSIS — R350 Frequency of micturition: Secondary | ICD-10-CM | POA: Diagnosis not present

## 2023-04-17 DIAGNOSIS — E349 Endocrine disorder, unspecified: Secondary | ICD-10-CM | POA: Diagnosis not present

## 2023-04-17 DIAGNOSIS — N401 Enlarged prostate with lower urinary tract symptoms: Secondary | ICD-10-CM | POA: Diagnosis not present

## 2023-04-17 DIAGNOSIS — R350 Frequency of micturition: Secondary | ICD-10-CM | POA: Diagnosis not present

## 2023-04-17 DIAGNOSIS — R972 Elevated prostate specific antigen [PSA]: Secondary | ICD-10-CM | POA: Diagnosis not present

## 2023-05-19 DIAGNOSIS — M542 Cervicalgia: Secondary | ICD-10-CM | POA: Diagnosis not present

## 2023-06-09 DIAGNOSIS — M47812 Spondylosis without myelopathy or radiculopathy, cervical region: Secondary | ICD-10-CM | POA: Diagnosis not present

## 2023-08-06 ENCOUNTER — Other Ambulatory Visit: Payer: Self-pay | Admitting: Family Medicine

## 2023-09-11 ENCOUNTER — Encounter: Payer: BC Managed Care – PPO | Admitting: Family Medicine

## 2023-09-20 DIAGNOSIS — Z0100 Encounter for examination of eyes and vision without abnormal findings: Secondary | ICD-10-CM | POA: Diagnosis not present

## 2023-09-25 ENCOUNTER — Ambulatory Visit (INDEPENDENT_AMBULATORY_CARE_PROVIDER_SITE_OTHER): Payer: BC Managed Care – PPO | Admitting: Family Medicine

## 2023-09-25 ENCOUNTER — Encounter: Payer: Self-pay | Admitting: Family Medicine

## 2023-09-25 VITALS — BP 112/70 | HR 57 | Temp 98.2°F | Ht 70.0 in | Wt 184.8 lb

## 2023-09-25 DIAGNOSIS — E041 Nontoxic single thyroid nodule: Secondary | ICD-10-CM

## 2023-09-25 DIAGNOSIS — Z131 Encounter for screening for diabetes mellitus: Secondary | ICD-10-CM | POA: Diagnosis not present

## 2023-09-25 DIAGNOSIS — E785 Hyperlipidemia, unspecified: Secondary | ICD-10-CM

## 2023-09-25 DIAGNOSIS — Z125 Encounter for screening for malignant neoplasm of prostate: Secondary | ICD-10-CM

## 2023-09-25 DIAGNOSIS — I10 Essential (primary) hypertension: Secondary | ICD-10-CM | POA: Diagnosis not present

## 2023-09-25 DIAGNOSIS — Z0000 Encounter for general adult medical examination without abnormal findings: Secondary | ICD-10-CM

## 2023-09-25 DIAGNOSIS — E663 Overweight: Secondary | ICD-10-CM

## 2023-09-25 NOTE — Progress Notes (Signed)
 Phone: [phone]   Subjective:  Patient presents today for their annual physical. Chief complaint-noted.   See problem oriented charting- ROS- full  review of systems was completed and negative  except for topics noted under acute/chronic concerns  The following were reviewed and entered/updated in epic: Past Medical History:  Diagnosis Date   Allergy    Arthritis    Joints   Cancer (HCC)    pre cancer- skin, 2016-10-16- melanoma   Complication of anesthesia    n/v   Family history of adverse reaction to anesthesia    Mother- N/V   Lipoma 10/27/2008   Excised from behind R arm     PONV (postoperative nausea and vomiting)    Seasonal allergies    Thyroid  disease    benign cyst- removed half of thyroid  10/16/20   Patient Active Problem List   Diagnosis Date Noted   S/P lumbar spinal fusion 07/24/2016    Priority: High   History of melanoma- [place] derm at least every 6 months 03/19/2018    Priority: Medium    Hyperlipidemia 10/07/2016    Priority: Medium    Hyperglycemia 02/15/2010    Priority: Medium    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    Priority: Medium    Essential hypertension 07/27/2007    Priority: Medium    Family history of melanoma 03/16/2014    Priority: Low   History of colonic polyps 03/16/2014    Priority: Low   Osteoarthritis 03/16/2014    Priority: Low   Allergic rhinitis 07/27/2007    Priority: Low   Past Surgical History:  Procedure Laterality Date   COLONOSCOPY     COLONOSCOPY     COLONOSCOPY W/ POLYPECTOMY     age 58   HIP SURGERY Right 10/16/2005   right; torn labrum   LUMBAR FUSION     L4-L5 late 16-Oct-2016   ROTATOR CUFF REPAIR Right    avulsed infraspinatus and supraspinatus   SPINE SURGERY  07/24/2016   L4-L5 PLIF   THYROIDECTOMY Left 10/15/2020   Procedure: LEFT THYROID  LOBECTOMY;  Surgeon: Carlie Clark, MD;  Location: [facility];  Service: ENT;  Laterality: Left;    Family History  Problem Relation Age of Onset   Hypertension  Mother    Sleep apnea Mother        overweight   Emphysema Mother        smoker   Psoriasis Mother        arthritis   Arthritis Mother    COPD Mother    Varicose Veins Mother    Colon cancer Father 54       end stage. died age 23 in 2014/10/17.    Pancreatic cancer Father 22       asymptomatic at presentation   Prostate cancer Father 94   Melanoma Father 62   Cancer Father    Prostate cancer Brother 46       now metastatic   Crohn's disease Brother    Tuberculosis Brother        inactive   Cancer Brother    Rheum arthritis Maternal Grandmother    Stomach cancer Neg Hx    Esophageal cancer Neg Hx    Rectal cancer Neg Hx     Medications- reviewed and updated Current Outpatient Medications  Medication Sig Dispense Refill   B Complex Vitamins (B COMPLEX-B12 PO) Take 1 tablet by mouth daily.      cholecalciferol (VITAMIN D) 1000 UNITS tablet Take 1,000 Units by  mouth daily.     glucosamine-chondroitin 500-400 MG tablet Take 2 tablets by mouth daily.      ibuprofen (ADVIL,MOTRIN) 200 MG tablet Take 800 mg by mouth 2 (two) times daily.     Misc Natural Products (BEE PROPOLIS PO) Take 1,000 mg by mouth 2 (two) times daily.     oxymetazoline (AFRIN) 0.05 % nasal spray Place 1 spray into both nostrils 2 (two) times daily as needed for congestion.     tadalafil  (CIALIS ) 20 MG tablet TAKE 1 TABLET BY MOUTH EVERY OTHER DAY 90 tablet 3   testosterone  (ANDROGEL ) 50 MG/5GM (1%) GEL PLACE 2.5 GRAMS ONTO THE SKIN DAILY.     No current facility-administered medications for this visit.    Allergies-reviewed and updated Allergies  Allergen Reactions   Percocet [Oxycodone -Acetaminophen ] Nausea And Vomiting    Unsure if anesthesia or Percocet   Tramadol  Nausea And Vomiting   Dilaudid [Hydromorphone Hcl] Nausea And Vomiting   Latex Other (See Comments)    Canker sores from dental procedures from gloves   Other Nausea And Vomiting    Severe nausea /vomiting from anesthesia or dilaudid.  Not  sure which one caused it.     Social History   Social History Narrative   Married. 1 son from 1st marriage 34 years old in 2016.       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 Oropharynx normal Neck: no thyromegaly CV: RRR no murmurs rubs or gallops Lungs: CTAB no crackles, wheeze, rhonchi Abdomen: soft/nontender/nondistended/normal bowel sounds. No rebound or guarding.  Ext: no edema Skin: warm, dry Neuro: grossly normal, moves all extremities, PERRLA Declines genitourinary or rectal concerns or exams   Assessment and Plan  58 y.o. male presenting for annual physical.  Health Maintenance counseling: 1. Anticipatory guidance: Patient counseled regarding regular dental exams -q6 months, eye exams -yearly,  avoiding smoking and second hand smoke , limiting alcohol to 2 beverages per day - 3 a month, no illicit drugs .   2. Risk factor reduction:  Advised patient of need for regular exercise and diet rich and fruits and vegetables to reduce risk of heart attack and stroke.  Exercise- still exercising regularly 1.5 hours most days.  Diet/weight management-weight down 4 pounds from last year- still eating really clean plus being able to run again- before back 24 miles a week then down to 9-12 and up to 12 now- does about 3 at a time about 4 days a week and back tolerating and neck.  Wt Readings from Last 3 Encounters:  09/25/23 184 lb 12.8 oz (83.8 kg)  01/01/23 193 lb 6.4 oz (87.7 kg)  09/05/22 188 lb 6.4 oz (85.5 kg)   3. Immunizations/screenings/ancillary studies-holding off on flu shot and COVID shot and Prevnar 20.   Planning on Shingrix around age 58  Immunization History  Administered Date(s) Administered   Influenza-Unspecified 10/04/2013, 11/28/2015   PFIZER(Purple Top)SARS-COV-2 Vaccination 04/14/2019, 05/05/2019, 12/27/2019   Td 07/27/2007   Tdap 03/18/2018  4. Prostate cancer screening- reassuring prostate  MRI in 2024 with Dr. Nieves.  Continues to follow-up with him with last visit in March and reassuring EXoDx. Most recent PSA back down to 2.38 after going up over 4 Lab Results  Component Value Date   PSA 2.69 03/28/2021   PSA 3.03 03/01/2021   PSA 1.31 02/23/2020   5. Colon cancer screening -  2024 with 5-year repeat for family history-1 polyp at age 89 6. Skin cancer screening-every 6 months with melanoma history- had another along the scar last year on left arm. advised regular sunscreen use. Denies worrisome, changing, or new skin lesions.  7. Smoking associated screening (lung cancer screening, AAA screen 65-75, UA)- never smoker 8. STD screening -only active with wife  Status of chronic or acute concerns   # Social update-wife on Femara at [facility] still- incurable cancer.  In general counteracted weight gain from Femara . May be dealing with long COVID. He thinks he may have had COVID about 5 weeks ago- he's not having lingering symptoms. She was much mor eill though - son doing well at [facility]- starting kicker!   # Neck pain-has been working with Dr. Darlis- RFA last October 2024- pain down some and headaches better. Had another epidural in January and didn't help. In may redid ablation c3-c4 facet joint on left- considering redoing MRI as some issues on the right as well and to consider ablation- may wait 6 months  # Right total hip replacement with [facility] early 2025  #Low testosterone /testicular hypofunction S: Medication: back on testosterone  through urology in 2024 after reassuring prostate workup. Started back early August 2024.  -Significant issues with insurance in the  past-did receive letter approved through 2039 A/P: has upcoming  visit for labs with PSA and testosterone  so we will hold off- labs on 18th or so and visit later this month -if good report may be once a year again  #hypertension S: medication: none as diet and exercise controlled -Should avoid decongestants and Afrin  BP Readings from Last 3 Encounters:  09/25/23 112/70  01/01/23 120/64  09/05/22 132/80  A/P: well controlled without medications continue to monitor    #hyperlipidemia S: Medication:None and strongly against statin use for himself The 10-year ASCVD risk score (Arnett DK, et al., 2019) is: 6.2% Lab Results  Component Value Date   CHOL 203 (H) 09/16/2022   HDL 42.00 09/16/2022   LDLCALC 137 (H) 09/16/2022   LDLDIRECT 135.7 12/27/2009   TRIG 117.0 09/16/2022   CHOLHDL 5 09/16/2022  A/P: update lipids- consider CT calcium if risk gets above 7.5% -he may reach out to do this in January of next year- prefers to move forward with CT calcium likely    # Hyperglycemia/insulin resistance/prediabetes-fasting sugars high in the past but A1c peak of 5.9  S:  Medication: none Lab Results  Component Value Date   HGBA1C 5.9 01/01/2023   HGBA1C 5.6 02/23/2020   HGBA1C 5.5 02/15/2010  A/P: hopefully stable- update a1c today. Continue without meds for now   #Essential tremor-intermittent issues.  Worsens with caffeine. About the same lately   #Thyroid  cyst-history of left thyroid  lobectomy due to cyst enlargement-thankfully no malignancy and has not required replacement-check TSH at least annually including today   Recommended follow up: Return in about 1 year (around 09/24/2024) for physical or sooner if needed.Schedule b4 you leave.  Lab/Order associations: will come back fasting   ICD-10-CM   1. Preventative health care  Z00.00     2. Screening for diabetes mellitus  Z13.1 Hemoglobin A1c    3. Overweight  E66.3 Hemoglobin A1c    4. Essential hypertension  I10     5.  Hyperlipidemia, unspecified hyperlipidemia type  E78.5 Comprehensive metabolic panel with GFR    CBC with Differential/Platelet    Lipid panel    6. Screening for prostate cancer  Z12.5     7. Thyroid  cyst  E04.1 TSH      No orders of the defined types were placed in this encounter.   Return precautions advised.  Garnette Lukes, MD

## 2023-09-25 NOTE — Patient Instructions (Addendum)
 Schedule a lab visit at the check out desk within 2 weeks. Return for future fasting labs meaning nothing but water after midnight please. Ok to take your medications with water.   Thrilled you are doing better- making some progress!   Recommended follow up: Return in about 1 year (around 09/24/2024) for physical or sooner if needed.Schedule b4 you leave.

## 2023-09-29 ENCOUNTER — Other Ambulatory Visit (INDEPENDENT_AMBULATORY_CARE_PROVIDER_SITE_OTHER)

## 2023-09-29 ENCOUNTER — Ambulatory Visit: Payer: Self-pay | Admitting: Family Medicine

## 2023-09-29 DIAGNOSIS — Z131 Encounter for screening for diabetes mellitus: Secondary | ICD-10-CM | POA: Diagnosis not present

## 2023-09-29 DIAGNOSIS — E785 Hyperlipidemia, unspecified: Secondary | ICD-10-CM

## 2023-09-29 DIAGNOSIS — E041 Nontoxic single thyroid nodule: Secondary | ICD-10-CM | POA: Diagnosis not present

## 2023-09-29 DIAGNOSIS — E663 Overweight: Secondary | ICD-10-CM | POA: Diagnosis not present

## 2023-09-29 LAB — CBC WITH DIFFERENTIAL/PLATELET
Basophils Absolute: 0 K/uL (ref 0.0–0.1)
Basophils Relative: 0.6 % (ref 0.0–3.0)
Eosinophils Absolute: 0.2 K/uL (ref 0.0–0.7)
Eosinophils Relative: 3.4 % (ref 0.0–5.0)
HCT: 44.7 % (ref 39.0–52.0)
Hemoglobin: 15.5 g/dL (ref 13.0–17.0)
Lymphocytes Relative: 19.4 % (ref 12.0–46.0)
Lymphs Abs: 1 K/uL (ref 0.7–4.0)
MCHC: 34.7 g/dL (ref 30.0–36.0)
MCV: 84.4 fl (ref 78.0–100.0)
Monocytes Absolute: 0.4 K/uL (ref 0.1–1.0)
Monocytes Relative: 8.5 % (ref 3.0–12.0)
Neutro Abs: 3.6 K/uL (ref 1.4–7.7)
Neutrophils Relative %: 68.1 % (ref 43.0–77.0)
Platelets: 235 K/uL (ref 150.0–400.0)
RBC: 5.3 Mil/uL (ref 4.22–5.81)
RDW: 13 % (ref 11.5–15.5)
WBC: 5.2 K/uL (ref 4.0–10.5)

## 2023-09-29 LAB — COMPREHENSIVE METABOLIC PANEL WITH GFR
ALT: 19 U/L (ref 0–53)
AST: 20 U/L (ref 0–37)
Albumin: 4.2 g/dL (ref 3.5–5.2)
Alkaline Phosphatase: 45 U/L (ref 39–117)
BUN: 20 mg/dL (ref 6–23)
CO2: 29 meq/L (ref 19–32)
Calcium: 9.2 mg/dL (ref 8.4–10.5)
Chloride: 101 meq/L (ref 96–112)
Creatinine, Ser: 1.02 mg/dL (ref 0.40–1.50)
GFR: 81.42 mL/min (ref >60.00)
Glucose, Bld: 113 mg/dL — ABNORMAL HIGH (ref 70–99)
Potassium: 4.3 meq/L (ref 3.5–5.1)
Sodium: 137 meq/L (ref 135–145)
Total Bilirubin: 0.7 mg/dL (ref 0.2–1.2)
Total Protein: 6.8 g/dL (ref 6.0–8.3)

## 2023-09-29 LAB — LIPID PANEL
Cholesterol: 192 mg/dL (ref 0–200)
HDL: 49.8 mg/dL (ref >39.00)
LDL Cholesterol: 122 mg/dL — ABNORMAL HIGH (ref 0–99)
NonHDL: 142.26
Total CHOL/HDL Ratio: 4
Triglycerides: 100 mg/dL (ref 0.0–149.0)
VLDL: 20 mg/dL (ref 0.0–40.0)

## 2023-09-29 LAB — HEMOGLOBIN A1C: Hgb A1c MFr Bld: 6 % (ref 4.6–6.5)

## 2023-09-29 LAB — TSH: TSH: 2.5 u[IU]/mL (ref 0.35–5.50)

## 2023-10-08 DIAGNOSIS — E349 Endocrine disorder, unspecified: Secondary | ICD-10-CM | POA: Diagnosis not present

## 2023-10-16 DIAGNOSIS — E349 Endocrine disorder, unspecified: Secondary | ICD-10-CM | POA: Diagnosis not present

## 2023-10-20 DIAGNOSIS — Z85820 Personal history of malignant melanoma of skin: Secondary | ICD-10-CM | POA: Diagnosis not present

## 2023-10-20 DIAGNOSIS — D1801 Hemangioma of skin and subcutaneous tissue: Secondary | ICD-10-CM | POA: Diagnosis not present

## 2023-10-20 DIAGNOSIS — L821 Other seborrheic keratosis: Secondary | ICD-10-CM | POA: Diagnosis not present

## 2023-10-20 DIAGNOSIS — L812 Freckles: Secondary | ICD-10-CM | POA: Diagnosis not present

## 2024-09-30 ENCOUNTER — Encounter: Admitting: Family Medicine
